# Patient Record
Sex: Male | Born: 1990 | Race: White | Hispanic: No | Marital: Single | Smoking: Former smoker
Health system: Southern US, Community
[De-identification: ages and names within clinical notes are randomized; demographics above are authoritative.]

## PROBLEM LIST (undated history)

## (undated) DIAGNOSIS — S3600XA Unspecified injury of spleen, initial encounter: Secondary | ICD-10-CM

## (undated) DIAGNOSIS — M549 Dorsalgia, unspecified: Secondary | ICD-10-CM

---
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
## 2008-02-14 ENCOUNTER — Emergency Department (HOSPITAL_COMMUNITY): Admission: EM | Admit: 2008-02-14 | Discharge: 2008-02-14 | Payer: Self-pay | Admitting: Emergency Medicine

## 2009-06-25 ENCOUNTER — Emergency Department (HOSPITAL_COMMUNITY): Admission: EM | Admit: 2009-06-25 | Discharge: 2009-06-26 | Payer: Self-pay | Admitting: Emergency Medicine

## 2009-07-01 ENCOUNTER — Inpatient Hospital Stay (HOSPITAL_COMMUNITY): Admission: EM | Admit: 2009-07-01 | Discharge: 2009-07-03 | Payer: Self-pay | Admitting: Emergency Medicine

## 2009-07-07 ENCOUNTER — Inpatient Hospital Stay (HOSPITAL_COMMUNITY): Admission: EM | Admit: 2009-07-07 | Discharge: 2009-07-10 | Payer: Self-pay | Admitting: Emergency Medicine

## 2009-08-31 ENCOUNTER — Encounter: Admission: RE | Admit: 2009-08-31 | Discharge: 2009-08-31 | Payer: Self-pay | Admitting: General Surgery

## 2009-10-23 ENCOUNTER — Emergency Department (HOSPITAL_COMMUNITY): Admission: EM | Admit: 2009-10-23 | Discharge: 2009-10-24 | Payer: Self-pay | Admitting: Emergency Medicine

## 2010-01-09 ENCOUNTER — Ambulatory Visit (HOSPITAL_COMMUNITY): Admission: RE | Admit: 2010-01-09 | Discharge: 2010-01-09 | Payer: Self-pay | Admitting: Emergency Medicine

## 2010-06-10 ENCOUNTER — Encounter: Payer: Self-pay | Admitting: General Surgery

## 2010-08-09 LAB — CBC
HCT: 28.9 % — ABNORMAL LOW (ref 39.0–52.0)
Hemoglobin: 11.1 g/dL — ABNORMAL LOW (ref 13.0–17.0)
Hemoglobin: 9.9 g/dL — ABNORMAL LOW (ref 13.0–17.0)
MCHC: 34.5 g/dL (ref 30.0–36.0)
MCHC: 34.6 g/dL (ref 30.0–36.0)
MCHC: 34.9 g/dL (ref 30.0–36.0)
MCHC: 35 g/dL (ref 30.0–36.0)
MCV: 89.9 fL (ref 78.0–100.0)
MCV: 89.9 fL (ref 78.0–100.0)
MCV: 90.4 fL (ref 78.0–100.0)
Platelets: 272 10*3/uL (ref 150–400)
Platelets: 322 10*3/uL (ref 150–400)
Platelets: 392 10*3/uL (ref 150–400)
RBC: 3.57 MIL/uL — ABNORMAL LOW (ref 4.22–5.81)
RBC: 3.66 MIL/uL — ABNORMAL LOW (ref 4.22–5.81)
RDW: 12.1 % (ref 11.5–15.5)
RDW: 12.3 % (ref 11.5–15.5)
WBC: 5.4 10*3/uL (ref 4.0–10.5)
WBC: 7.7 10*3/uL (ref 4.0–10.5)

## 2010-08-09 LAB — POCT I-STAT, CHEM 8
Calcium, Ion: 1.17 mmol/L (ref 1.12–1.32)
HCT: 33 % — ABNORMAL LOW (ref 39.0–52.0)
Hemoglobin: 11.2 g/dL — ABNORMAL LOW (ref 13.0–17.0)
Potassium: 3.9 mEq/L (ref 3.5–5.1)

## 2010-08-09 LAB — URINALYSIS, ROUTINE W REFLEX MICROSCOPIC
Bilirubin Urine: NEGATIVE
Glucose, UA: NEGATIVE mg/dL
Ketones, ur: NEGATIVE mg/dL
Urobilinogen, UA: 1 mg/dL (ref 0.0–1.0)

## 2010-08-09 LAB — DIFFERENTIAL
Basophils Absolute: 0 10*3/uL (ref 0.0–0.1)
Eosinophils Absolute: 0.1 10*3/uL (ref 0.0–0.7)
Eosinophils Relative: 2 % (ref 0–5)
Eosinophils Relative: 3 % (ref 0–5)
Lymphocytes Relative: 28 % (ref 12–46)
Lymphs Abs: 1.7 10*3/uL (ref 0.7–4.0)
Lymphs Abs: 1.8 10*3/uL (ref 0.7–4.0)
Monocytes Absolute: 0.8 10*3/uL (ref 0.1–1.0)
Monocytes Relative: 14 % — ABNORMAL HIGH (ref 3–12)

## 2010-08-09 LAB — COMPREHENSIVE METABOLIC PANEL
ALT: 28 U/L (ref 0–53)
Albumin: 4.5 g/dL (ref 3.5–5.2)
Alkaline Phosphatase: 67 U/L (ref 39–117)
GFR calc Af Amer: >60 mL/min (ref >60)
GFR calc non Af Amer: >60 mL/min (ref >60)
Potassium: 3.7 mEq/L (ref 3.5–5.1)
Total Protein: 7.3 g/dL (ref 6.0–8.3)

## 2010-08-09 LAB — HEMOGLOBIN AND HEMATOCRIT, BLOOD
HCT: 27.8 % — ABNORMAL LOW (ref 39.0–52.0)
HCT: 31 % — ABNORMAL LOW (ref 39.0–52.0)
Hemoglobin: 10.5 g/dL — ABNORMAL LOW (ref 13.0–17.0)
Hemoglobin: 10.6 g/dL — ABNORMAL LOW (ref 13.0–17.0)
Hemoglobin: 11 g/dL — ABNORMAL LOW (ref 13.0–17.0)
Hemoglobin: 11.5 g/dL — ABNORMAL LOW (ref 13.0–17.0)
Hemoglobin: 9.7 g/dL — ABNORMAL LOW (ref 13.0–17.0)

## 2010-08-09 LAB — MONONUCLEOSIS SCREEN: Mono Screen: NEGATIVE

## 2010-08-09 LAB — PROTIME-INR: Prothrombin Time: 13.5 seconds (ref 11.6–15.2)

## 2010-08-09 LAB — TYPE AND SCREEN

## 2010-10-05 NOTE — Op Note (Signed)
[place]  Patient:    Andrew Castro, Andrew Castro                        MRN: [id number] Proc. Date: 10/30/99 Adm. Date:  [id number] Disc. Date: [id number] Attending:  Ellwood Handler CC:         Mary B. Lyn Hollingshead, M.D.                           Operative Report  DATE OF BIRTH:  Jan 10, 1991  REFERRING PHYSICIAN:  Patriciaann Clan. Lyn Hollingshead, M.D.  PREOPERATIVE DIAGNOSIS:  Balanitis and phimosis.  POSTOPERATIVE DIAGNOSIS:  Balanitis and phimosis.  PROCEDURE:  Circumcision.  SURGEON:  Verl Dicker, M.D.  ANESTHESIA:  General with a penile block.  INDICATIONS:  An 20 year old male with progressive problems with balanitis and phimosis.  DESCRIPTION OF PROCEDURE:  The patient was prepped and draped in the supine position after institution of an adequate level of general anesthesia (LMA). Preputial adhesions were taken down bluntly.  Smegma was cleaned from the subcoronal sulcus.  A circumferential incision was made proximal to the subcoronal sulcus.  A similar incision was made proximal to the original incision, and a ring of fibrotic preputial skin was removed in a "parallel lines technique."  Bleeding sites were lightly fulgurated using needle-tip cautery.  The skin edges were then reapproximated with interrupted sutures of 4-0 chromic.  The wound was covered with Bacitracin ointment, dry gauze, and Coban tape, and the patient was returned to recovery in satisfactory condition.  A penile block had been instituted prior to the beginning of the procedure using 0.25% Marcaine without epinephrine circumferentially around the base of the penis. DD:  10/30/99 TD:  11/01/99 Job: [id number]

## 2011-01-01 IMAGING — CT CT ABDOMEN W/ CM
2 of 4 series · 17 of 46 positions shown, 19 images · IV contrast (omnipaque)
Comparison: 07/01/2009.

CLINICAL DATA: Left-sided abdominal pain.  Fever.  Evaluate
splenic rupture.

CT ABDOMEN AND PELVIS WITH CONTRAST
TECHNIQUE: Multidetector CT imaging of the abdomen and pelvis was
performed using the standard protocol following bolus
administration of intravenous contrast.
Contrast: Wonder mL Omnipaque-JDD.

[Series 2: rtn ap with st · axial · 0.74mm/px · z∈[+942,+1218]mm · 14 of 61 slices shown, 16 images]
[im 3/61  soft-tissue]
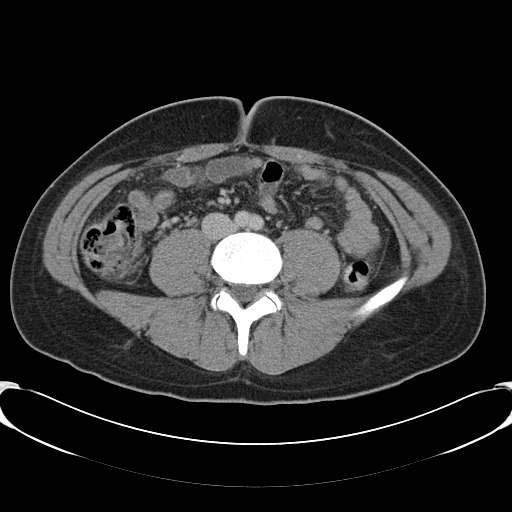
[im 3/61  bone]
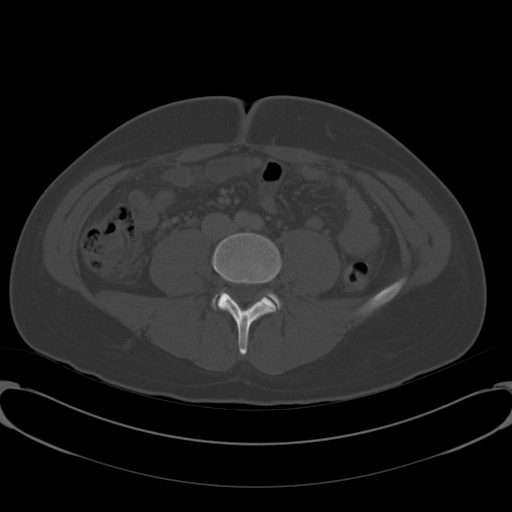
[im 8/61  soft-tissue]
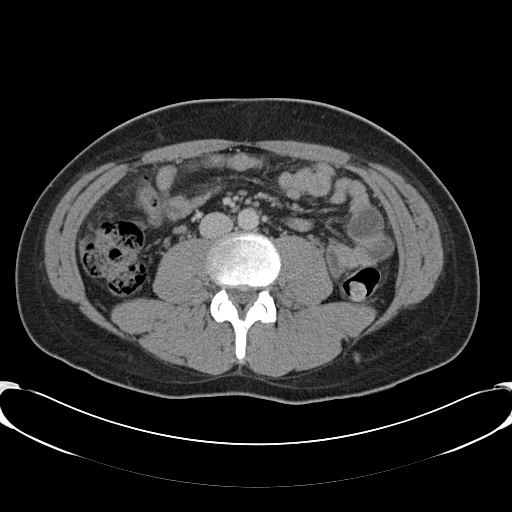
[im 11/61  soft-tissue]
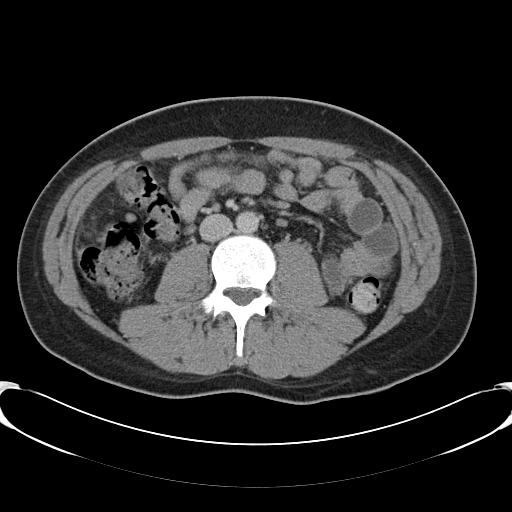
[im 16/61  soft-tissue]
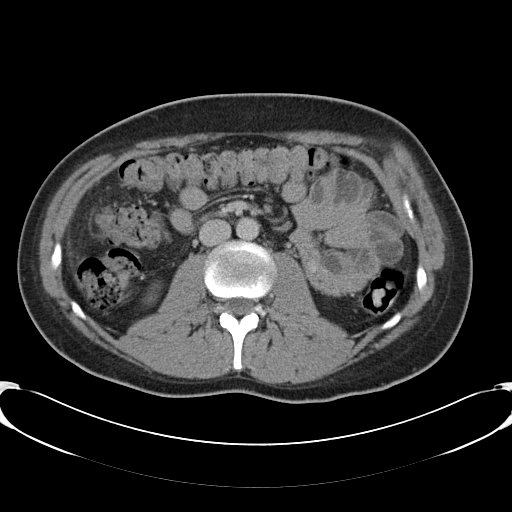
[im 21/61  soft-tissue]
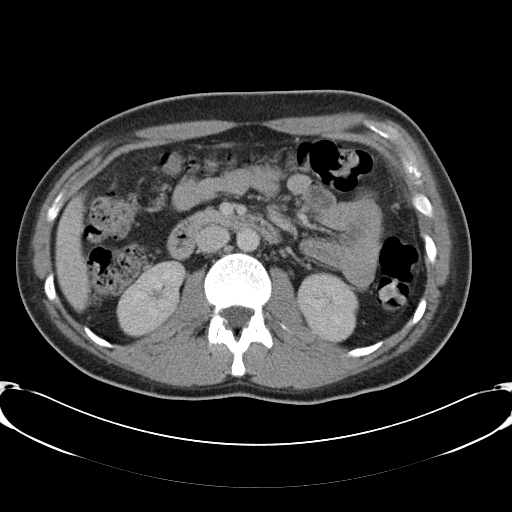
[im 24/61  soft-tissue]
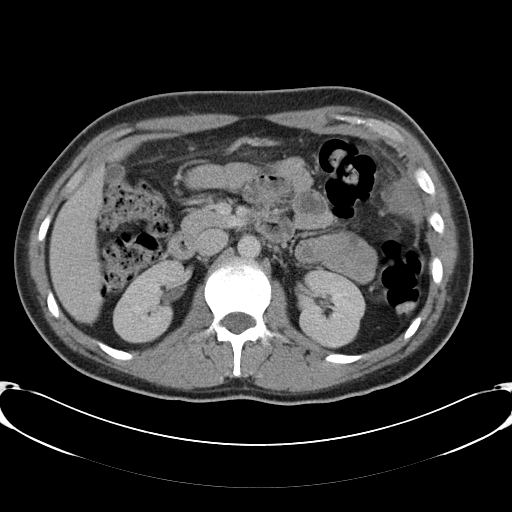
[im 29/61  soft-tissue]
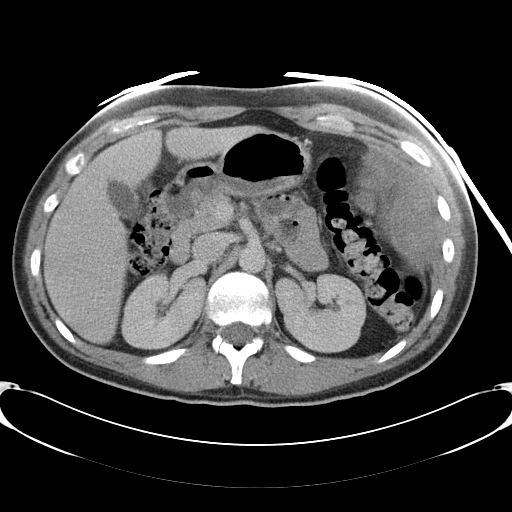
[im 32/61  soft-tissue]
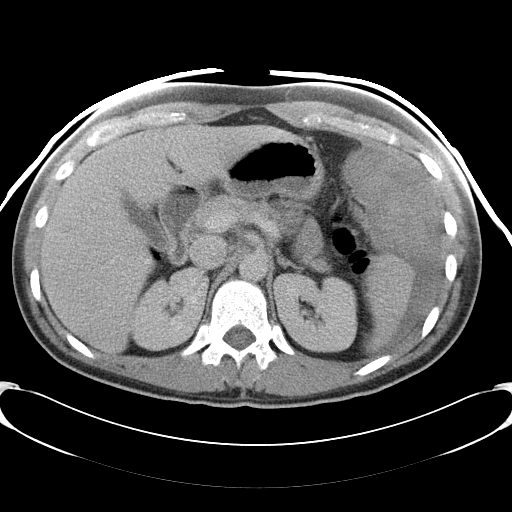
[im 37/61  soft-tissue]
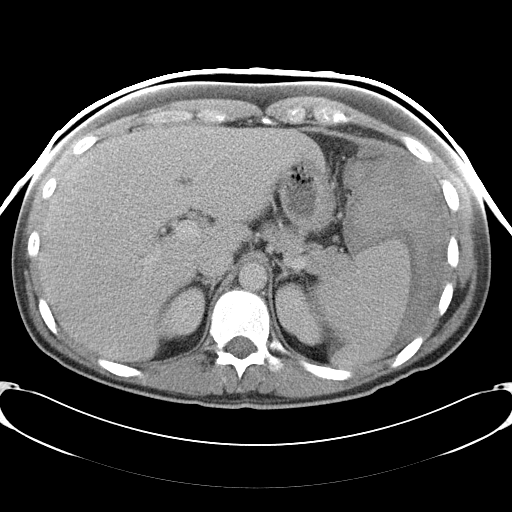
[im 37/61  bone]
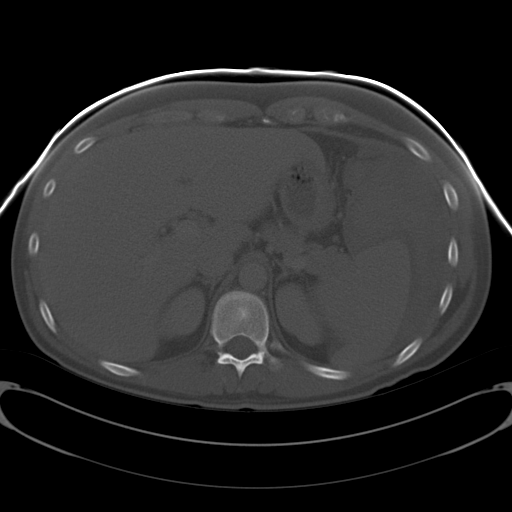
[im 40/61  soft-tissue]
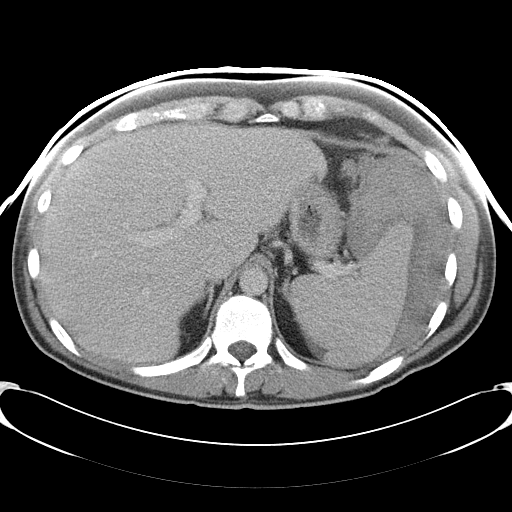
[im 45/61  soft-tissue]
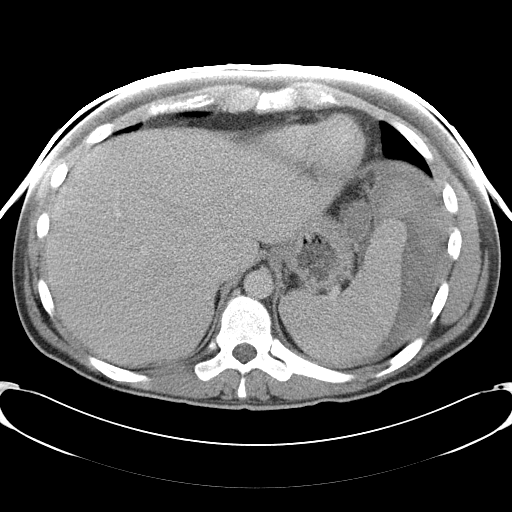
[im 50/61  soft-tissue]
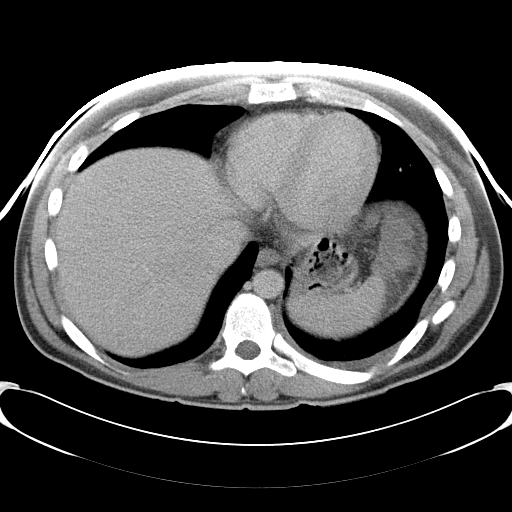
[im 53/61  soft-tissue]
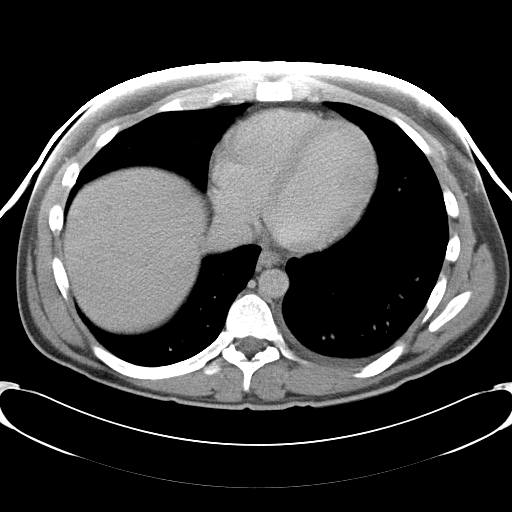
[im 58/61  soft-tissue]
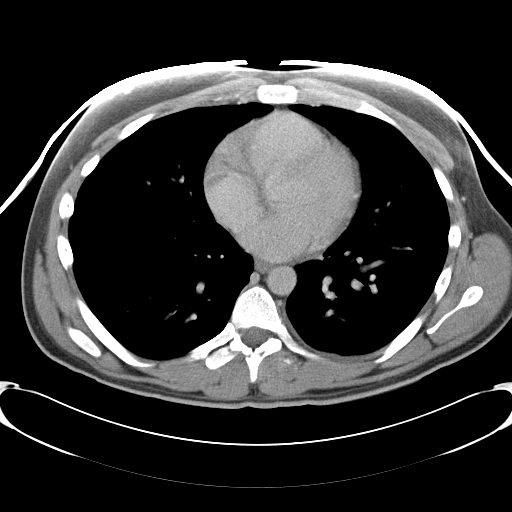

[Series 602: <mpr thick range> · coronal · 0.74mm/px · 3 of 76 slices shown]
[im 26/76  soft-tissue]
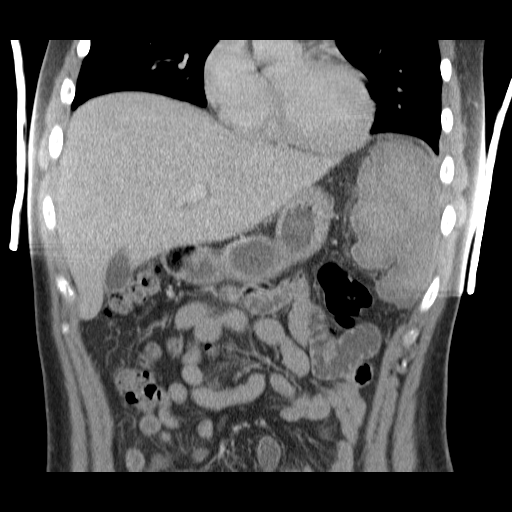
[im 34/76  soft-tissue]
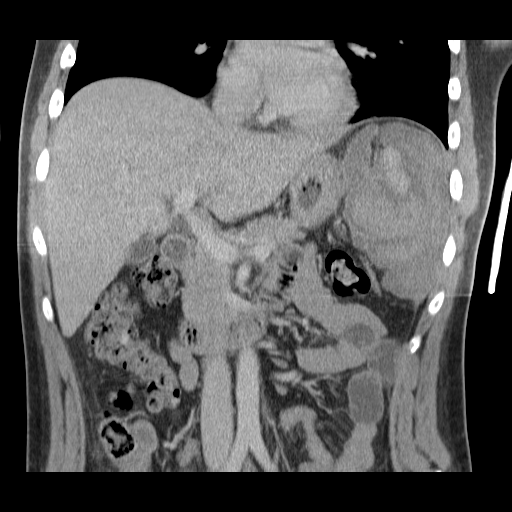
[im 42/76  soft-tissue]
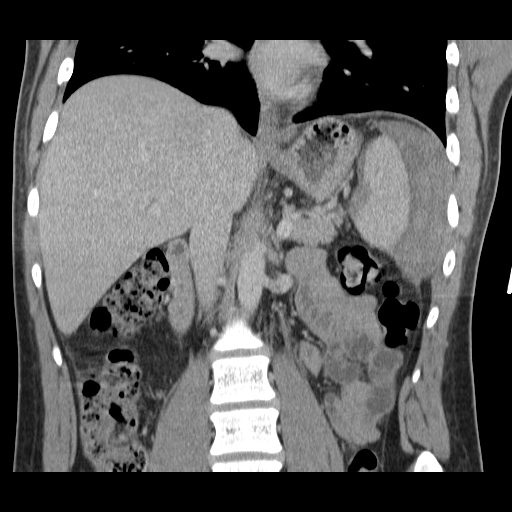

[17 of 46 positions shown; findings below may reference images not displayed]

FINDINGS: The dominant finding is a slight interval increase in
the size of perisplenic hematoma.  When measured at a similar point
to the prior examination, the hematoma on axial imaging just
anterior to the splenic margin measures 6.5 cm AP by 7.6 cm
transverse.  When measured on coronal [HOSPITAL] a similar location
to prior, this measures 13.7 cm cranial-caudal and 7.7 cm
transverse compared to 12.7 cm x 6.7 cm.

The liver and gallbladder as well as the kidneys and adrenals
appear normal.  Spleen unremarkable.  The site of splenic
laceration appears to be at the superior margin along the medial
edge.  Splenic artery appears normal.  Stomach and bowel
unremarkable.  No active extravasation is identified.
IMPRESSION: Slight interval enlargement of right upper quadrant perisplenic
hematoma.  This is previously quantify however these measurements
are inaccurate because of the crescentic configuration.

## 2011-07-02 ENCOUNTER — Ambulatory Visit (INDEPENDENT_AMBULATORY_CARE_PROVIDER_SITE_OTHER): Payer: BC Managed Care – PPO | Admitting: Physician Assistant

## 2011-07-02 VITALS — BP 112/78 | HR 74 | Temp 98.0°F | Resp 16 | Ht 74.0 in | Wt 190.0 lb

## 2011-07-02 DIAGNOSIS — J069 Acute upper respiratory infection, unspecified: Secondary | ICD-10-CM

## 2011-07-02 NOTE — Progress Notes (Signed)
SUBJECTIVE:  Andrew Castro is a 21 y.o. male who complains of congestion, nasal blockage, post nasal drip, headache, fatigue and bilateral sinus pain for 4 days. He denies a history of chills, fevers and cough and denies a history of asthma. Patient just quit smoking cigarettes 3 weeks ago. Using Tylenol Cold and Sinus.  OBJECTIVE: He appears well, vital signs are as noted. Ears normal.  Throat and pharynx red.  Neck supple. No adenopathy in the neck. Nose is congested. Sinuses non tender. The chest is clear, without wheezes or rales.  ASSESSMENT:  viral upper respiratory illness  PLAN: Symptomatic therapy suggested: push fluids, rest, gargle warm salt water, use ibuprofen and Mucinex D prn and return office visit prn if symptoms persist or worsen. Lack of antibiotic effectiveness discussed with him. Call or return to clinic prn if these symptoms worsen or fail to improve as anticipated.

## 2011-07-02 NOTE — Progress Notes (Deleted)
  Subjective:    Patient ID: Andrew Castro, male    DOB: 1990-11-16, 21 y.o.   MRN: [id number]  HPI    Review of Systems     Objective:   Physical Exam        Assessment & Plan:

## 2011-08-05 ENCOUNTER — Encounter: Payer: BC Managed Care – PPO | Admitting: Physician Assistant

## 2012-01-24 NOTE — Progress Notes (Signed)
This encounter was created in error - please disregard.

## 2012-03-24 ENCOUNTER — Ambulatory Visit (INDEPENDENT_AMBULATORY_CARE_PROVIDER_SITE_OTHER): Payer: BC Managed Care – PPO | Admitting: Internal Medicine

## 2012-03-24 VITALS — BP 148/86 | HR 73 | Temp 98.1°F | Resp 18 | Ht 75.0 in | Wt 215.0 lb

## 2012-03-24 DIAGNOSIS — B081 Molluscum contagiosum: Secondary | ICD-10-CM

## 2012-03-24 DIAGNOSIS — Z23 Encounter for immunization: Secondary | ICD-10-CM

## 2012-03-24 MED ORDER — PODOFILOX 0.5 % EX GEL: Freq: Two times a day (BID) | CUTANEOUS | Status: DC

## 2012-03-24 NOTE — Progress Notes (Signed)
  Subjective:    Patient ID: Andrew Castro, male    DOB: 02-14-1991, 21 y.o.   MRN: [id number]  HPI Healthy male with rash on genitals 1 month   Review of Systems     Objective:   Physical Exam Classic molluscum contagiosum       Assessment & Plan:  Molluscum contagiosum Trial of condylox HPV vac 1st dose Counsel

## 2012-03-24 NOTE — Patient Instructions (Signed)
Molluscum Contagiosum  Molluscum contagiosum is a viral infection of the skin that causes smooth surfaced, firm, small (3 to 5 mm), dome-shaped bumps (papules) which are flesh-colored. The bumps usually do not hurt or itch. In children, they most often appear on the face, trunk, arms and legs. In adults, the growths are commonly found on the genitals, thighs, face, neck, and belly (abdomen). The infection may be spread to others by close (skin to skin) contact (such as occurs in schools and swimming pools), sharing towels and clothing, and through sexual contact. The bumps usually disappear without treatment in 2 to 4 months, especially in children. You may have them treated to avoid spreading them. Scraping (curetting) the middle part (central plug) of the bump with a needle or sharp curette, or application of liquid nitrogen for 8 or 9 seconds usually cures the infection.  HOME CARE INSTRUCTIONS    Do not scratch the bumps. This may spread the infection to other parts of the body and to other people.   Avoid close contact with others, including sexual contact, until the bumps disappear. Do not share towels or clothing.   If liquid nitrogen was used, blisters will form. Leave the blisters alone and cover with a bandage. The tops will fall off by themselves in 7 to 14 days.   Four months without a lesion is usually a cure.  SEEK IMMEDIATE MEDICAL CARE IF:   You have a fever.   You develop swelling, redness, pain, tenderness, or warmth in the areas of the bumps. They may be infected.  Document Released: 05/03/2000 Document Revised: 07/29/2011 Document Reviewed: 10/14/2008  Exit[employer] Patient Information 2013 [employer].

## 2012-10-04 ENCOUNTER — Encounter (HOSPITAL_COMMUNITY): Payer: Self-pay | Admitting: *Deleted

## 2012-10-04 ENCOUNTER — Emergency Department (HOSPITAL_COMMUNITY)
Admission: EM | Admit: 2012-10-04 | Discharge: 2012-10-05 | Disposition: A | Discharge status: Home / Self Care | Payer: BC Managed Care – PPO | Attending: Emergency Medicine | Admitting: Emergency Medicine

## 2012-10-04 DIAGNOSIS — Z87828 Personal history of other (healed) physical injury and trauma: Secondary | ICD-10-CM | POA: Insufficient documentation

## 2012-10-04 DIAGNOSIS — M545 Low back pain, unspecified: Secondary | ICD-10-CM | POA: Insufficient documentation

## 2012-10-04 DIAGNOSIS — Z8719 Personal history of other diseases of the digestive system: Secondary | ICD-10-CM | POA: Insufficient documentation

## 2012-10-04 DIAGNOSIS — M549 Dorsalgia, unspecified: Secondary | ICD-10-CM

## 2012-10-04 DIAGNOSIS — Z87891 Personal history of nicotine dependence: Secondary | ICD-10-CM | POA: Insufficient documentation

## 2012-10-04 HISTORY — DX: Unspecified injury of spleen, initial encounter: S36.00XA

## 2012-10-04 HISTORY — DX: Dorsalgia, unspecified: M54.9

## 2012-10-04 MED ORDER — IBUPROFEN 800 MG PO TABS: 800.0000 mg | ORAL_TABLET | Freq: Three times a day (TID) | ORAL | Status: DC

## 2012-10-04 MED ORDER — CYCLOBENZAPRINE HCL 10 MG PO TABS: 10.0000 mg | ORAL_TABLET | Freq: Two times a day (BID) | ORAL | Status: DC | PRN

## 2012-10-04 MED ORDER — OXYCODONE-ACETAMINOPHEN 5-325 MG PO TABS
1.0000 | ORAL_TABLET | Freq: Once | ORAL | Status: AC
  Administered 2012-10-05: 1 via ORAL
  Filled 2012-10-04: qty 1

## 2012-10-04 MED ORDER — OXYCODONE-ACETAMINOPHEN 5-325 MG PO TABS: 1.0000 | ORAL_TABLET | ORAL | Status: DC | PRN

## 2012-10-04 NOTE — ED Notes (Signed)
Two months ago injuried back, today slide off stool, now states he can not stand,

## 2012-10-04 NOTE — ED Provider Notes (Signed)
History    This chart was scribed for non-physician practitioner Elpidio Anis PA-C working with Doug Sou, MD by Smitty Pluck, ED scribe. This patient was seen in room WTR7/WTR7 and the patient's care was started at 11:36 PM.    CSN: [id number]  Arrival date & time 10/04/12  2118      Chief Complaint  Patient presents with  . Back Pain    The history is provided by the patient and medical records. No language interpreter was used.   HPI Comments: Andrew Castro is a 22 y.o. male who presents to the Emergency Department complaining of constant, moderate, lower back pain onset today. Pt reports that he injured his back 2 months ago and wore a back brace since then. Today he pushed a stool and had sudden onset of back pain. He has taken flexeril and tylenol without relief. He reports that he unable to stand due to pain. Pt denies fall, numbness, radiation of pain, urinary/bowel incontinence, fever, chills, nausea, vomiting, diarrhea, weakness, cough, SOB and any other pain.   Past Medical History  Diagnosis Date  . Back pain   . Spleen injury     History reviewed. No pertinent past surgical history.  No family history on file.  History  Substance Use Topics  . Smoking status: Former Smoker    Quit date: 06/11/2011  . Smokeless tobacco: Not on file  . Alcohol Use: Yes     Comment: occasionally      Review of Systems  Constitutional: Negative for fever and chills.  Respiratory: Negative for shortness of breath.   Gastrointestinal: Negative for nausea and vomiting.  Musculoskeletal: Positive for back pain.  Neurological: Negative for weakness.    Allergies  Review of patient's allergies indicates no known allergies.  Home Medications   Current Outpatient Rx  Name  Route  Sig  Dispense  Refill  . podofilox (CONDYLOX) 0.5 % gel   Topical   Apply topically 2 (two) times daily. Apply 3days per week for 2-3 weeks   3.5 g   1     BP 129/88  Pulse 116   Temp(Src) 98.4 F (36.9 C) (Oral)  Resp 20  Ht 6\' 3"  (1.905 m)  Wt 225 lb (102.059 kg)  BMI 28.12 kg/m2  SpO2 100%  Physical Exam  Nursing note and vitals reviewed. Constitutional: He is oriented to person, place, and time. He appears well-developed and well-nourished. No distress.  HENT:  Head: Normocephalic and atraumatic.  Eyes: EOM are normal.  Neck: Neck supple. No tracheal deviation present.  Cardiovascular: Normal rate, regular rhythm and normal heart sounds.   Pulmonary/Chest: Effort normal and breath sounds normal. No respiratory distress.  Abdominal: Soft. He exhibits no distension. There is no tenderness.  Musculoskeletal: Normal range of motion.  Midline lumbar tenderness No swelling  Moves all extremities  Neurological: He is alert and oriented to person, place, and time.  Skin: Skin is warm and dry.  Psychiatric: He has a normal mood and affect. His behavior is normal.    ED Course  Procedures (including critical care time) DIAGNOSTIC STUDIES: Oxygen Saturation is 100% on room air, normal by my interpretation.    COORDINATION OF CARE: 11:38 PM Discussed ED treatment with pt and pt agrees.     Labs Reviewed - No data to display No results found.   No diagnosis found.  1. Low back pain   MDM  Uncomplicated low back pain without neurologic deficit.  I personally performed the services described in this documentation, which was scribed in my presence. The recorded information has been reviewed and is accurate.     Arnoldo Hooker, PA-C 10/05/12 0104

## 2012-10-06 NOTE — ED Provider Notes (Signed)
Medical screening examination/treatment/procedure(s) were performed by non-physician practitioner and as supervising physician I was immediately available for consultation/collaboration.  Doug Sou, MD 10/06/12 1504

## 2013-01-11 ENCOUNTER — Ambulatory Visit (INDEPENDENT_AMBULATORY_CARE_PROVIDER_SITE_OTHER): Payer: BC Managed Care – PPO | Admitting: Family Medicine

## 2013-01-11 VITALS — BP 122/74 | HR 72 | Temp 98.8°F | Resp 18 | Ht 74.25 in | Wt 230.6 lb

## 2013-01-11 DIAGNOSIS — J3489 Other specified disorders of nose and nasal sinuses: Secondary | ICD-10-CM

## 2013-01-11 DIAGNOSIS — R0981 Nasal congestion: Secondary | ICD-10-CM

## 2013-01-11 LAB — POCT CBC
Granulocyte percent: 58.2 %G (ref 37–80)
Lymph, poc: 1.7 (ref 0.6–3.4)
POC Granulocyte: 3 (ref 2–6.9)
POC MID %: 8.5 %M (ref 0–12)
Platelet Count, POC: 260 10*3/uL (ref 142–424)
RDW, POC: 13.5 %
WBC: 5.1 10*3/uL (ref 4.6–10.2)

## 2013-01-11 MED ORDER — PREDNISONE 20 MG PO TABS: ORAL_TABLET | ORAL | Status: DC

## 2013-01-11 NOTE — Progress Notes (Signed)
[facility] [address] [place] [address] [phone]  Date:  01/11/2013   Name:  Andrew Castro   DOB:  01-19-1991   MRN:  [id number]  PCP:  No primary provider on file.    Chief Complaint: Nasal Congestion, Sore Throat and Cough   History of Present Illness:  Andrew Castro is a 22 y.o. very pleasant male patient who presents with the following:  A few days ago he noted onset of fatigue, cough.  He felt worse today. He has noted sneezing, dry cough, watery eyes, feels very tired, severe nasal congestion.  He has itchy eyes and throat.  No fever.  No body aches or chills.  No sore throat.   No GI symptoms.    So far he tried an OTC dayquil He is generally healthy, is not smoking.   His friend/ coworker was ill recently.  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    There are no active problems to display for this patient.   Past Medical History  Diagnosis Date  . Back pain   . Spleen injury     History reviewed. No pertinent past surgical history.  History  Substance Use Topics  . Smoking status: Former Smoker    Quit date: 06/11/2011  . Smokeless tobacco: Not on file  . Alcohol Use: Yes     Comment: occasionally    History reviewed. No pertinent family history.  No Known Allergies  Medication list has been reviewed and updated.  Current Outpatient Prescriptions on File Prior to Visit  Medication Sig Dispense Refill  . cyclobenzaprine (FLEXERIL) 10 MG tablet Take 1 tablet (10 mg total) by mouth 2 (two) times daily as needed for muscle spasms.  20 tablet  0  . ibuprofen (ADVIL,MOTRIN) 800 MG tablet Take 1 tablet (800 mg total) by mouth 3 (three) times daily.  21 tablet  0  . oxyCODONE-acetaminophen (PERCOCET/ROXICET) 5-325 MG per tablet Take 1 tablet by mouth every 4 (four) hours as needed for pain.  15 tablet  0  . podofilox (CONDYLOX) 0.5 % gel Apply topically 2 (two)  times daily. Apply 3days per week for 2-3 weeks  3.5 g  1   No current facility-administered medications on file prior to visit.    Review of Systems:  As per HPI- otherwise negative.   Physical Examination: Filed Vitals:   01/11/13 1602  BP: 122/74  Pulse: 72  Temp: 98.8 F (37.1 C)  Resp: 18   Filed Vitals:   01/11/13 1602  Height: 6' 2.25" (1.886 m)  Weight: 230 lb 9.6 oz (104.599 kg)   Body mass index is 29.41 kg/(m^2). Ideal Body Weight: Weight in (lb) to have BMI = 25: 195.6  GEN: WDWN, NAD, Non-toxic, A & O x 3, looks well HEENT: Atraumatic, Normocephalic. Neck supple. No masses, No LAD.  Bilateral TM wnl, oropharynx normal.  PEERL,EOMI.  Nasal cavity is inflamed Ears and Nose: No external deformity. CV: RRR, No M/G/R. No JVD. No thrill. No extra heart sounds. PULM: CTA B, no wheezes, crackles, rhonchi. No retractions. No resp. distress. No accessory muscle use. ABD: S, NT, ND EXTR: No c/c/e NEURO Normal gait.  PSYCH: Normally interactive. Conversant. Not depressed or anxious appearing.  Calm demeanor.   Results for orders placed in visit on 01/11/13  POCT CBC      Result Value Range  WBC 5.1  4.6 - 10.2 K/uL   Lymph, poc 1.7  0.6 - 3.4   POC LYMPH PERCENT 33.3  10 - 50 %L   MID (cbc) 0.4  0 - 0.9   POC MID % 8.5  0 - 12 %M   POC Granulocyte 3.0  2 - 6.9   Granulocyte percent 58.2  37 - 80 %G   RBC 5.39  4.69 - 6.13 M/uL   Hemoglobin 16.3  14.1 - 18.1 g/dL   HCT, POC 16.1  09.6 - 53.7 %   MCV 93.0  80 - 97 fL   MCH, POC 30.2  27 - 31.2 pg   MCHC 32.5  31.8 - 35.4 g/dL   RDW, POC 04.5     Platelet Count, POC 260  142 - 424 K/uL   MPV 10.1  0 - 99.8 fL    Assessment and Plan: Nasal congestion - Plan: POCT CBC, predniSONE (DELTASONE) 20 MG tablet  Suspect viral infection or allergic rhinitis.  He is miserable with nasal congestion and drainage. Will treat with prednisone- 3 day course only.  Advised regarding OTC medications as well.  Let me know if  not better in the next few days, Sooner if worse.     Signed Abbe Amsterdam, MD

## 2013-01-11 NOTE — Patient Instructions (Addendum)
Use the prednisone as directed.  Drink plenty of water.  Use tylenol as needed for aches and pains, and use OTC zyrtec or claritin every day for the next week or two.  You can use ibuprofen once you are done with the prednisone.   If you are not better in the next few days please let me know- Sooner if worse.

## 2013-05-14 ENCOUNTER — Ambulatory Visit (INDEPENDENT_AMBULATORY_CARE_PROVIDER_SITE_OTHER): Payer: BC Managed Care – PPO | Admitting: Internal Medicine

## 2013-05-14 VITALS — BP 158/94 | HR 112 | Temp 99.0°F | Resp 18 | Ht 74.5 in | Wt 237.0 lb

## 2013-05-14 DIAGNOSIS — J019 Acute sinusitis, unspecified: Secondary | ICD-10-CM

## 2013-05-14 MED ORDER — AMOXICILLIN 875 MG PO TABS: 875.0000 mg | ORAL_TABLET | Freq: Two times a day (BID) | ORAL | Status: DC

## 2013-05-14 MED ORDER — HYDROCODONE-HOMATROPINE 5-1.5 MG/5ML PO SYRP: 5.0000 mL | ORAL_SOLUTION | Freq: Four times a day (QID) | ORAL | Status: DC | PRN

## 2013-05-14 NOTE — Progress Notes (Signed)
   Subjective:    Patient ID: Andrew Castro, male    DOB: [birth year], 22 y.o.   MRN: [id number]  HPI illness started as a cold and has progressed and facial pressure with headaches and purulent sinus discharge over the past 3 days No fever Nonproductive cough which keeps him awake at night Doing well in general previously healthy   [employer] burrito Review of Systems Noncontributory    Objective:   Physical Exam BP 158/94  Pulse 112  Temp(Src) 99 F (37.2 C) (Oral)  Resp 18  Ht 6' 2.5" (1.892 m)  Wt 237 lb (107.502 kg)  BMI 30.03 kg/m2  SpO2 99% TMs clear Nares boggy with purulent discharge Tender maxillary areas to percussion Throat clear No nodes Chest clear       Assessment & Plan:  Acute sinusitis, unspecified  elevated blood pressure without diagnosis of hypertension-needs recheck when well Meds ordered this encounter  Medications  . amoxicillin (AMOXIL) 875 MG tablet    Sig: Take 1 tablet (875 mg total) by mouth 2 (two) times daily.    Dispense:  20 tablet    Refill:  0  . HYDROcodone-homatropine (HYCODAN) 5-1.5 MG/5ML syrup    Sig: Take 5 mLs by mouth every 6 (six) hours as needed for cough.    Dispense:  120 mL    Refill:  0

## 2014-12-16 ENCOUNTER — Ambulatory Visit (INDEPENDENT_AMBULATORY_CARE_PROVIDER_SITE_OTHER): Payer: 59 | Admitting: Internal Medicine

## 2014-12-16 VITALS — BP 118/76 | HR 82 | Temp 98.6°F | Resp 16 | Ht 75.0 in | Wt 249.2 lb

## 2014-12-16 DIAGNOSIS — R5383 Other fatigue: Secondary | ICD-10-CM

## 2014-12-16 DIAGNOSIS — R0683 Snoring: Secondary | ICD-10-CM

## 2014-12-16 DIAGNOSIS — R202 Paresthesia of skin: Secondary | ICD-10-CM

## 2014-12-16 DIAGNOSIS — M545 Low back pain, unspecified: Secondary | ICD-10-CM

## 2014-12-16 DIAGNOSIS — F419 Anxiety disorder, unspecified: Secondary | ICD-10-CM

## 2014-12-16 LAB — POCT CBC
Granulocyte percent: 49.7 %G (ref 37–80)
HEMATOCRIT: 47.3 % (ref 43.5–53.7)
Hemoglobin: 16 g/dL (ref 14.1–18.1)
Lymph, poc: 1.4 (ref 0.6–3.4)
MCH: 28.9 pg (ref 27–31.2)
MCHC: 33.9 g/dL (ref 31.8–35.4)
MCV: 85.2 fL (ref 80–97)
MID (cbc): 0.4 (ref 0–0.9)
MPV: 8.5 fL (ref 0–99.8)
PLATELET COUNT, POC: 226 10*3/uL (ref 142–424)
POC Granulocyte: 1.8 — AB (ref 2–6.9)
POC LYMPH PERCENT: 39 %L (ref 10–50)
POC MID %: 11.3 %M (ref 0–12)
RBC: 5.55 M/uL (ref 4.69–6.13)
RDW, POC: 13.4 %
WBC: 3.7 10*3/uL — AB (ref 4.6–10.2)

## 2014-12-16 LAB — GLUCOSE, POCT (MANUAL RESULT ENTRY): POC Glucose: 51 mg/dl — AB (ref 70–99)

## 2014-12-16 MED ORDER — ALPRAZOLAM 0.25 MG PO TABS: 0.2500 mg | ORAL_TABLET | Freq: Three times a day (TID) | ORAL | Status: DC | PRN

## 2014-12-16 NOTE — Progress Notes (Addendum)
Subjective:    Patient ID: Andrew Castro, male    DOB: [birth year], 24 y.o.   MRN: [id number] This chart was scribed for Ellamae Sia, MD by Jolene Provost, Medical Scribe. This patient was seen in Room 12 and the patient's care was started a 4:28 PM.  Chief Complaint  Patient presents with  . Numbness    both feet, toes , comes and goes   . Fatigue    x 2 months  . soreness under eye    rt., x 2 days    HPI HPI Comments: Andrew Castro is a 24 y.o. male who presents to [facility] complaining of foot numbness for the last two months. Pt states this sx happene intermittently at various times per day. Pt states when it is happening he can touch his toes and won't be able to feel the pressure. He can put his feet up without any change in his sx. 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 He symptoms are confined to the tips of all of his toes  Pt is also complaining of increased fatigue for the last month, worse around noon. Pt states he is also having trouble sleeping at night. He has a 26 year old at home. The pt's wife has told him his snoring has become really bad lately. Pt drove seven hours to [place] with his shoes off, and he experienced numbness in his feet. Pt also states he has intermittent low back pain, and states he threw his back out on the second day of his new job to the point that he saw stars.   Pt states he is urinating more, and had two beers a few weeks ago and had to pea every ten minutes, which has never happened before.   Pt is also complaining of swelling in hands for the last few weeks. Now working at a new job in the heat all day.  Father just died of liver cancer 2 months after his diagnosis and this was just 2 months ago. He finds himself thinking about his father every day and morning the loss of his father's role in the life of his 85 month old. This is caused him to be anxious all the time when he is left alone to  think. Is affecting him at work and for sleep.  He has had no recent febrile illnesses  Happily married new 21-month-old baby  Review of Systems  Constitutional: Negative for fever and chills.  HENT: Negative for trouble swallowing.   Eyes: Negative for visual disturbance.  Respiratory: Negative for shortness of breath.   Cardiovascular: Negative for chest pain and palpitations.  Gastrointestinal: Negative for abdominal pain.  Genitourinary: Negative for dysuria.  Musculoskeletal: Positive for back pain.  Neurological: Positive for numbness. Negative for dizziness and headaches.       Objective:   Physical Exam  Constitutional: He is oriented to person, place, and time. He appears well-developed and well-nourished. No distress.  HENT:  Head: Normocephalic and atraumatic.  Right Ear: External ear normal.  Left Ear: External ear normal.  Nose: Nose normal.  Mouth/Throat: Oropharynx is clear and moist.  The pharynx does not appear shallow nor obstructed by the adenoids//nasal passages are fairly clear  Eyes: Conjunctivae and EOM are normal. Pupils are equal, round, and reactive to light.  Neck: Neck supple. No thyromegaly present.  Cardiovascular: Normal rate, regular rhythm and normal heart sounds.   No murmur  heard. Pulmonary/Chest: Effort normal and breath sounds normal. No respiratory distress.  Musculoskeletal: Normal range of motion.  Straight leg raise negative to 90 bilaterally Lumbar palpitation nontender Extremities no sensory or motor losses  DT reflexes symmetrical Gait normal  Lymphadenopathy:    He has no cervical adenopathy.  Neurological: He is alert and oriented to person, place, and time. No cranial nerve deficit.  Skin: Skin is warm and dry. He is not diaphoretic.  Psychiatric: He has a normal mood and affect. His behavior is normal. Thought content normal.  Nursing note and vitals reviewed.  BP 118/76 mmHg  Pulse 82  Temp(Src) 98.6 F (37 C) (Oral)   Resp 16  Ht 6\' 3"  (1.905 m)  Wt 249 lb 3.2 oz (113.036 kg)  BMI 31.15 kg/m2  SpO2 98% Results for orders placed or performed in visit on 12/16/14  POCT CBC  Result Value Ref Range   WBC 3.7 (A) 4.6 - 10.2 K/uL   Lymph, poc 1.4 0.6 - 3.4   POC LYMPH PERCENT 39.0 10 - 50 %L   MID (cbc) 0.4 0 - 0.9   POC MID % 11.3 0 - 12 %M   POC Granulocyte 1.8 (A) 2 - 6.9   Granulocyte percent 49.7 37 - 80 %G   RBC 5.55 4.69 - 6.13 M/uL   Hemoglobin 16.0 14.1 - 18.1 g/dL   HCT, POC 78.4 69.6 - 53.7 %   MCV 85.2 80 - 97 fL   MCH, POC 28.9 27 - 31.2 pg   MCHC 33.9 31.8 - 35.4 g/dL   RDW, POC 29.5 %   Platelet Count, POC 226 142 - 424 K/uL   MPV 8.5 0 - 99.8 fL  POCT glucose (manual entry)  Result Value Ref Range   POC Glucose 51 (A) 70 - 99 mg/dl       Assessment & Plan:  Other fatigue - Plan: Metabolic profile and EB virus serology due to lymphocytosis  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 Meds ordered this encounter  Medications  . ALPRAZolam (XANAX) 0.25 MG tablet    Sig: Take 1 tablet (0.25 mg total) by mouth 3 (three) times daily as needed for anxiety.    Dispense:  90 tablet    Refill:  0     I have completed the patient encounter in its entirety as documented by the scribe, with editing by me where necessary. Basil Blakesley P. Merla Riches, M.D.

## 2014-12-17 ENCOUNTER — Encounter: Payer: Self-pay | Admitting: Internal Medicine

## 2014-12-17 LAB — COMPREHENSIVE METABOLIC PANEL
ALBUMIN: 4.5 g/dL (ref 3.6–5.1)
ALT: 36 U/L (ref 9–46)
AST: 21 U/L (ref 10–40)
Alkaline Phosphatase: 93 U/L (ref 40–115)
BUN: 12 mg/dL (ref 7–25)
CALCIUM: 9.2 mg/dL (ref 8.6–10.3)
CHLORIDE: 104 mmol/L (ref 98–110)
CO2: 25 mmol/L (ref 20–31)
Creat: 0.76 mg/dL (ref 0.60–1.35)
GLUCOSE: 69 mg/dL (ref 65–99)
POTASSIUM: 4.4 mmol/L (ref 3.5–5.3)
Sodium: 140 mmol/L (ref 135–146)
TOTAL PROTEIN: 6.7 g/dL (ref 6.1–8.1)
Total Bilirubin: 0.5 mg/dL (ref 0.2–1.2)

## 2014-12-17 LAB — TSH: TSH: 1.844 u[IU]/mL (ref 0.350–4.500)

## 2014-12-19 LAB — EPSTEIN-BARR VIRUS VCA ANTIBODY PANEL: EBV VCA IgM: <10 U/mL (ref <36.0)

## 2015-02-05 ENCOUNTER — Ambulatory Visit (INDEPENDENT_AMBULATORY_CARE_PROVIDER_SITE_OTHER): Payer: 59 | Admitting: Emergency Medicine

## 2015-02-05 VITALS — BP 126/80 | HR 69 | Temp 98.2°F | Resp 18 | Ht 75.0 in | Wt 247.4 lb

## 2015-02-05 DIAGNOSIS — J0140 Acute pansinusitis, unspecified: Secondary | ICD-10-CM | POA: Diagnosis not present

## 2015-02-05 DIAGNOSIS — J209 Acute bronchitis, unspecified: Secondary | ICD-10-CM | POA: Diagnosis not present

## 2015-02-05 MED ORDER — PSEUDOEPHEDRINE-GUAIFENESIN ER 60-600 MG PO TB12: 1.0000 | ORAL_TABLET | Freq: Two times a day (BID) | ORAL | Status: DC

## 2015-02-05 MED ORDER — AMOXICILLIN-POT CLAVULANATE 875-125 MG PO TABS: 1.0000 | ORAL_TABLET | Freq: Two times a day (BID) | ORAL | Status: DC

## 2015-02-05 MED ORDER — HYDROCOD POLST-CPM POLST ER 10-8 MG/5ML PO SUER: 5.0000 mL | Freq: Two times a day (BID) | ORAL | Status: DC

## 2015-02-05 MED ORDER — ALPRAZOLAM 0.25 MG PO TABS: 0.2500 mg | ORAL_TABLET | Freq: Three times a day (TID) | ORAL | Status: DC | PRN

## 2015-02-05 NOTE — Patient Instructions (Signed)

## 2015-02-05 NOTE — Progress Notes (Signed)
Subjective:  Patient ID: Andrew Castro, male    DOB: 1990-07-29  Age: 24 y.o. MRN: [id number]  CC: Cough; Nasal Congestion; and Medication Refill   HPI Andrew Castro presents  nasal congestion postnasal drainage and nasal discharge all purulent color. He has a cough with no wheezing or shortness of breath or sputum is purulent. He has no fever chills. He has no nausea or vomiting. No stool change. No rash. Said no improvement with over-the-counter medications been ill for a week.  History Orange has a past medical history of Back pain and Spleen injury.   He has no past surgical history on file.   His  family history includes Cancer in his father.  He   reports that he quit smoking about 3 years ago. He has never used smokeless tobacco. He reports that he does not drink alcohol or use illicit drugs.  Outpatient Prescriptions Prior to Visit  Medication Sig Dispense Refill  . ALPRAZolam (XANAX) 0.25 MG tablet Take 1 tablet (0.25 mg total) by mouth 3 (three) times daily as needed for anxiety. 90 tablet 0   No facility-administered medications prior to visit.    Social History   Social History  . Marital Status: Single    Spouse Name: N/A  . Number of Children: N/A  . Years of Education: N/A   Social History Main Topics  . Smoking status: Former Smoker    Quit date: 06/11/2011  . Smokeless tobacco: Never Used  . Alcohol Use: No     Comment: occasionally  . Drug Use: No  . Sexual Activity: Yes   Other Topics Concern  . None   Social History Narrative     Review of Systems  Constitutional: Negative for fever, chills and appetite change.  HENT: Positive for congestion, postnasal drip, rhinorrhea and sinus pressure. Negative for ear pain and sore throat.   Eyes: Negative for pain and redness.  Respiratory: Positive for cough. Negative for shortness of breath and wheezing.   Cardiovascular: Negative for leg swelling.  Gastrointestinal: Negative for nausea,  vomiting, abdominal pain, diarrhea, constipation and blood in stool.  Endocrine: Negative for polyuria.  Genitourinary: Negative for dysuria, urgency, frequency and flank pain.  Musculoskeletal: Negative for gait problem.  Skin: Negative for rash.  Neurological: Negative for weakness and headaches.  Psychiatric/Behavioral: Negative for confusion and decreased concentration. The patient is not nervous/anxious.     Objective:  BP 126/80 mmHg  Pulse 69  Temp(Src) 98.2 F (36.8 C) (Oral)  Resp 18  Ht  (1.905 m)  Wt 247 lb 6.4 oz (112.22 kg)  BMI 30.92 kg/m2  SpO2 98%  Physical Exam  Constitutional: He is oriented to person, place, and time. He appears well-developed and well-nourished. No distress.  HENT:  Head: Normocephalic and atraumatic.  Right Ear: External ear normal.  Left Ear: External ear normal.  Nose: Nose normal.  Eyes: Conjunctivae and EOM are normal. Pupils are equal, round, and reactive to light. No scleral icterus.  Neck: Normal range of motion. Neck supple. No tracheal deviation present.  Cardiovascular: Normal rate, regular rhythm and normal heart sounds.   Pulmonary/Chest: Effort normal. No respiratory distress. He has no wheezes. He has no rales.  Abdominal: He exhibits no mass. There is no tenderness. There is no rebound and no guarding.  Musculoskeletal: He exhibits no edema.  Lymphadenopathy:    He has no cervical adenopathy.  Neurological: He is alert and oriented to person, place, and time. Coordination normal.  Skin: Skin is warm and dry. No rash noted.  Psychiatric: He has a normal mood and affect. His behavior is normal.      Assessment & Plan:   Edge was seen today for cough, nasal congestion and medication refill.  Diagnoses and all orders for this visit:  Acute bronchitis, unspecified organism  Acute pansinusitis, recurrence not specified  Other orders -     amoxicillin-clavulanate (AUGMENTIN) 875-125 MG per tablet; Take 1 tablet  by mouth 2 (two) times daily. -     pseudoephedrine-guaifenesin (MUCINEX D) 60-600 MG per tablet; Take 1 tablet by mouth every 12 (twelve) hours. -     chlorpheniramine-HYDROcodone (TUSSIONEX PENNKINETIC ER) 10-8 MG/5ML SUER; Take 5 mLs by mouth 2 (two) times daily. -     ALPRAZolam (XANAX) 0.25 MG tablet; Take 1 tablet (0.25 mg total) by mouth 3 (three) times daily as needed for anxiety.  I am having Mr. Haile start on amoxicillin-clavulanate, pseudoephedrine-guaifenesin, and chlorpheniramine-HYDROcodone. I am also having him maintain his ALPRAZolam.  Meds ordered this encounter  Medications  . amoxicillin-clavulanate (AUGMENTIN) 875-125 MG per tablet    Sig: Take 1 tablet by mouth 2 (two) times daily.    Dispense:  20 tablet    Refill:  0  . pseudoephedrine-guaifenesin (MUCINEX D) 60-600 MG per tablet    Sig: Take 1 tablet by mouth every 12 (twelve) hours.    Dispense:  18 tablet    Refill:  0  . chlorpheniramine-HYDROcodone (TUSSIONEX PENNKINETIC ER) 10-8 MG/5ML SUER    Sig: Take 5 mLs by mouth 2 (two) times daily.    Dispense:  60 mL    Refill:  0  . ALPRAZolam (XANAX) 0.25 MG tablet    Sig: Take 1 tablet (0.25 mg total) by mouth 3 (three) times daily as needed for anxiety.    Dispense:  90 tablet    Refill:  1    Appropriate red flag conditions were discussed with the patient as well as actions that should be taken.  Patient expressed his understanding.  Follow-up: Return if symptoms worsen or fail to improve.  Carmelina Dane, MD

## 2015-03-06 ENCOUNTER — Ambulatory Visit (INDEPENDENT_AMBULATORY_CARE_PROVIDER_SITE_OTHER): Payer: 59

## 2015-03-06 ENCOUNTER — Ambulatory Visit (INDEPENDENT_AMBULATORY_CARE_PROVIDER_SITE_OTHER): Payer: 59 | Admitting: Family Medicine

## 2015-03-06 VITALS — BP 118/82 | HR 101 | Temp 98.7°F | Resp 18 | Ht 75.0 in | Wt 251.0 lb

## 2015-03-06 DIAGNOSIS — M545 Low back pain, unspecified: Secondary | ICD-10-CM

## 2015-03-06 MED ORDER — NAPROXEN 500 MG PO TABS: 500.0000 mg | ORAL_TABLET | Freq: Two times a day (BID) | ORAL | Status: DC

## 2015-03-06 MED ORDER — CYCLOBENZAPRINE HCL 10 MG PO TABS: 5.0000 mg | ORAL_TABLET | Freq: Three times a day (TID) | ORAL | Status: DC | PRN

## 2015-03-06 MED ORDER — ACETAMINOPHEN 500 MG PO TABS: 1000.0000 mg | ORAL_TABLET | Freq: Three times a day (TID) | ORAL | Status: DC | PRN

## 2015-03-06 NOTE — Progress Notes (Signed)
03/06/2015 at 5:42 PM  Andrew Castro / DOB: Aug 22, 1990 / MRN: [id number]  The patient  does not have a problem list on file.  SUBJECTIVE  Andrew Castro is a 24 y.o. male complaining of stable "achy" midline low back pain that started 4 days ago. Associated symptoms include no other symptoms, and he denies weakness, numbness, tingling, dysuria.Treatments tried thus far include OTC NSAIDS with fair  relief. He denies fever, nausea, dysuria, frequency and urgency. He initially hurt his back 1 year ago lifting a heavy log and has had four flares since.     He  has a past medical history of Back pain and Spleen injury.    Medications reviewed and updated by myself where necessary, and exist elsewhere in the encounter.   Andrew Castro has No Known Allergies. He  reports that he quit smoking about 3 years ago. He has never used smokeless tobacco. He reports that he does not drink alcohol or use illicit drugs. He  reports that he currently engages in sexual activity. The patient  has no past surgical history on file.  His family history includes Cancer in his father.  Review of Systems  Constitutional: Negative for fever.  Eyes: Negative.   Respiratory: Negative.   Cardiovascular: Negative.   Genitourinary: Negative for dysuria, urgency and frequency.  Musculoskeletal: Positive for myalgias and back pain. Negative for falls.  Skin: Negative for itching and rash.  Neurological: Positive for tingling. Negative for dizziness and headaches.    OBJECTIVE  His  height is 6\' 3"  (1.905 m) and weight is 251 lb (113.853 kg). His oral temperature is 98.7 F (37.1 C). His blood pressure is 118/82 and his pulse is 101. His respiration is 18 and oxygen saturation is 98%.  The patient's body mass index is 31.37 kg/(m^2).  Physical Exam  Constitutional: He is oriented to person, place, and time. He appears well-developed.  HENT:  Head: Normocephalic.  Eyes: Pupils are equal, round, and reactive to light.    Neck: Normal range of motion.  Cardiovascular: Normal rate.   Respiratory: Effort normal.  GI: Soft.  Musculoskeletal: Normal range of motion.  Neurological: He is alert and oriented to person, place, and time. He has normal strength. He displays normal reflexes. No cranial nerve deficit or sensory deficit. Gait (antalgic) abnormal. He displays no Babinski's sign on the right side. He displays no Babinski's sign on the left side.  Reflex Scores:      Patellar reflexes are 2+ on the right side and 2+ on the left side.      Achilles reflexes are 2+ on the right side and 2+ on the left side. Skin: Skin is warm and dry.    [facility] reading (PRIMARY) by  Dr. Clelia CroftShaw: Normal.     No results found for this or any previous visit (from the past 24 hour(s)).  ASSESSMENT & PLAN  Andrew Castro was seen today for back pain and depression.  Diagnoses and all orders for this visit:  Bilateral low back pain without sciatica -     DG Lumbar Spine Complete; Future -     naproxen (NAPROSYN) 500 MG tablet; Take 1 tablet (500 mg total) by mouth 2 (two) times daily with a meal. -     acetaminophen (TYLENOL) 500 MG tablet; Take 2 tablets (1,000 mg total) by mouth every 8 (eight) hours as needed for moderate pain. -     cyclobenzaprine (FLEXERIL) 10 MG tablet; Take 0.5-1 tablets (5-10 mg  total) by mouth 3 (three) times daily as needed for muscle spasms. May cause drowsiness.    The patient was advised to call or come back to clinic if he does not see an improvement in symptoms, or worsens with the above plan.   Deliah Boston, MHS, PA-C [facility] 03/06/2015 5:42 PM

## 2015-03-16 NOTE — Progress Notes (Signed)
Patient ID: Andrew Castro, male   DOB: 19-Oct-1990, [age] y.o.   MRN: [id number] Reviewed documentation and xray and agree w/ assessment and plan. Norberto SorensonEva Shaw, MD MPH

## 2015-04-19 ENCOUNTER — Ambulatory Visit (INDEPENDENT_AMBULATORY_CARE_PROVIDER_SITE_OTHER): Payer: 59 | Admitting: Family Medicine

## 2015-04-19 VITALS — BP 132/80 | HR 94 | Temp 98.5°F | Resp 16 | Ht 75.0 in | Wt 244.0 lb

## 2015-04-19 DIAGNOSIS — J069 Acute upper respiratory infection, unspecified: Secondary | ICD-10-CM

## 2015-04-19 MED ORDER — AMOXICILLIN 500 MG PO CAPS: 500.0000 mg | ORAL_CAPSULE | Freq: Three times a day (TID) | ORAL | Status: DC

## 2015-04-19 MED ORDER — FLUTICASONE PROPIONATE 50 MCG/ACT NA SUSP: 2.0000 | Freq: Every day | NASAL | Status: DC

## 2015-04-19 NOTE — Progress Notes (Signed)
Subjective:  This chart was scribed for Elvina SidleKurt Candence Sease MD,  by Veverly FellsHatice Demirci,scribe, at [facility].  This patient was seen in room 13 and the patient's care was started at 9:20 AM.    Patient ID: Andrew Castro, male    DOB: 1990/09/18, 24 y.o.   MRN: [id number] Chief Complaint  Patient presents with  . Nasal Congestion    Onset this AM and drainage down throat  . Discharge from both eyes  . Dizziness    Light headed    HPI  HPI Comments: Andrew Castro is a 24 y.o. male who presents to the [facility] complaining of nasal congestion, eye pain with itching/drainiage/pain, throat pain(with drainage) and head pressure/dizziness onset last night.  He has associated symptoms of sensitive skin and feels like his symptoms are worsening.  He took an amoxilcillin this morning which he had left over from his last Bronchitis infection.  Patient states that he gets colds often this time of year and just got over bronchitis recently.  Patient does not smoke. Patient will [place] leaving [place] the [place] on [place].   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   Patient will be purchasing a home with his house soon and will be changing his job once he does so.   Patient denies a cough, fever/chills.    There are no active problems to display for this patient.  Past Medical History  Diagnosis Date  . Back pain   . Spleen injury    No past surgical history on file. No Known Allergies Prior to Admission medications   Medication Sig Start Date End Date Taking? Authorizing Provider  acetaminophen (TYLENOL) 500 MG tablet Take 2 tablets (1,000 mg total) by mouth every 8 (eight) hours as needed for moderate pain. Patient not taking: Reported on 04/19/2015 03/06/15   Ofilia NeasMichael L Clark, PA-C  ALPRAZolam Prudy Feeler(XANAX) 0.25 MG tablet Take 1 tablet (0.25 mg total) by mouth 3 (three) times daily as needed for anxiety. Patient not taking: Reported on  04/19/2015 02/05/15   Carmelina DaneJeffery S Anderson, MD  cyclobenzaprine (FLEXERIL) 10 MG tablet Take 0.5-1 tablets (5-10 mg total) by mouth 3 (three) times daily as needed for muscle spasms. May cause drowsiness. Patient not taking: Reported on 04/19/2015 03/06/15   Ofilia NeasMichael L Clark, PA-C  ibuprofen (ADVIL,MOTRIN) 200 MG tablet Take 200 mg by mouth every 6 (six) hours as needed.    Historical Provider, MD  naproxen (NAPROSYN) 500 MG tablet Take 1 tablet (500 mg total) by mouth 2 (two) times daily with a meal. Patient not taking: Reported on 04/19/2015 03/06/15   Ofilia NeasMichael L Clark, PA-C   Social History   Social History  . Marital Status: Single    Spouse Name: N/A  . Number of Children: N/A  . Years of Education: N/A   Occupational History  . Not on file.   Social History Main Topics  . Smoking status: Former Smoker    Quit date: 06/11/2011  . Smokeless tobacco: Never Used  . Alcohol Use: No     Comment: occasionally  . Drug Use: No  . Sexual Activity: Yes   Other Topics Concern  . Not on file   Social History Narrative        Review of Systems  Constitutional: Negative for fever and chills.  HENT: Positive for congestion, postnasal drip and sore throat.   Eyes: Positive for pain, redness and itching.  Respiratory: Negative for cough, choking and shortness of breath.   Gastrointestinal: Negative for nausea and vomiting.  Musculoskeletal: Negative for back pain, neck pain and neck stiffness.  Neurological: Positive for dizziness. Negative for syncope and speech difficulty.       Objective:   Physical Exam  Constitutional: He appears well-developed and well-nourished. No distress.  HENT:  Head: Normocephalic and atraumatic.  Eyes: Pupils are equal, round, and reactive to light.    Filed Vitals:   04/19/15 0858  BP: 132/80  Pulse: 94  Temp: 98.5 F (36.9 C)  TempSrc: Oral  Resp: 16  Height:  (1.905 m)  Weight: 244 lb (110.678 kg)         Assessment & Plan:     This chart was scribed in my presence and reviewed by me personally.    ICD-9-CM ICD-10-CM   1. 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 That reason I'm giving him Flonase.  Because he is going on vacation to the [place] in the next couple days, I printed out a prescription for amoxicillin in case she's not getting better. I explained that it's better to not take antibiotic unless he absolutely needs to  Signed, Elvina Sidle, MD

## 2015-04-19 NOTE — Patient Instructions (Signed)
Upper Respiratory Infection, Adult Most upper respiratory infections (URIs) are a viral infection of the air passages leading to the lungs. A URI affects the nose, throat, and upper air passages. The most common type of URI is nasopharyngitis and is typically referred to as "the common cold." URIs run their course and usually go away on their own. Most of the time, a URI does not require medical attention, but sometimes a bacterial infection in the upper airways can follow a viral infection. This is called a secondary infection. Sinus and middle ear infections are common types of secondary upper respiratory infections. Bacterial pneumonia can also complicate a URI. A URI can worsen asthma and chronic obstructive pulmonary disease (COPD). Sometimes, these complications can require emergency medical care and may be life threatening.  CAUSES Almost all URIs are caused by viruses. A virus is a type of germ and can spread from one person to another.  RISKS FACTORS You may be at risk for a URI if:   You smoke.   You have chronic heart or lung disease.  You have a weakened defense (immune) system.   You are very young or very old.   You have nasal allergies or asthma.  You work in crowded or poorly ventilated areas.  You work in health care facilities or schools. SIGNS AND SYMPTOMS  Symptoms typically develop 2-3 days after you come in contact with a cold virus. Most viral URIs last 7-10 days. However, viral URIs from the influenza virus (flu virus) can last 14-18 days and are typically more severe. Symptoms may include:   Runny or stuffy (congested) nose.   Sneezing.   Cough.   Sore throat.   Headache.   Fatigue.   Fever.   Loss of appetite.   Pain in your forehead, behind your eyes, and over your cheekbones (sinus pain).  Muscle aches.  DIAGNOSIS  Your health care provider may diagnose a URI by:  Physical exam.  Tests to check that your symptoms are not due to  another condition such as:  Strep throat.  Sinusitis.  Pneumonia.  Asthma. TREATMENT  A URI goes away on its own with time. It cannot be cured with medicines, but medicines may be prescribed or recommended to relieve symptoms. Medicines may help:  Reduce your fever.  Reduce your cough.  Relieve nasal congestion. HOME CARE INSTRUCTIONS   Take medicines only as directed by your health care provider.   Gargle warm saltwater or take cough drops to comfort your throat as directed by your health care provider.  Use a warm mist humidifier or inhale steam from a shower to increase air moisture. This may make it easier to breathe.  Drink enough fluid to keep your urine clear or pale yellow.   Eat soups and other clear broths and maintain good nutrition.   Rest as needed.   Return to work when your temperature has returned to normal or as your health care provider advises. You may need to stay home longer to avoid infecting others. You can also use a face mask and careful hand washing to prevent spread of the virus.  Increase the usage of your inhaler if you have asthma.   Do not use any tobacco products, including cigarettes, chewing tobacco, or electronic cigarettes. If you need help quitting, ask your health care provider. PREVENTION  The best way to protect yourself from getting a cold is to practice good hygiene.   Avoid oral or hand contact with people with cold   symptoms.   Wash your hands often if contact occurs.  There is no clear evidence that vitamin C, vitamin E, echinacea, or exercise reduces the chance of developing a cold. However, it is always recommended to get plenty of rest, exercise, and practice good nutrition.  SEEK MEDICAL CARE IF:   You are getting worse rather than better.   Your symptoms are not controlled by medicine.   You have chills.  You have worsening shortness of breath.  You have brown or red mucus.  You have yellow or brown nasal  discharge.  You have pain in your face, especially when you bend forward.  You have a fever.  You have swollen neck glands.  You have pain while swallowing.  You have white areas in the back of your throat. SEEK IMMEDIATE MEDICAL CARE IF:   You have severe or persistent:  Headache.  Ear pain.  Sinus pain.  Chest pain.  You have chronic lung disease and any of the following:  Wheezing.  Prolonged cough.  Coughing up blood.  A change in your usual mucus.  You have a stiff neck.  You have changes in your:  Vision.  Hearing.  Thinking.  Mood. MAKE SURE YOU:   Understand these instructions.  Will watch your condition.  Will get help right away if you are not doing well or get worse.   This information is not intended to replace advice given to you by your health care provider. Make sure you discuss any questions you have with your health care provider.   Document Released: 10/30/2000 Document Revised: 09/20/2014 Document Reviewed: 08/11/2013 Elsevier Interactive Patient Education 2016 [employer]  

## 2015-08-05 ENCOUNTER — Ambulatory Visit (INDEPENDENT_AMBULATORY_CARE_PROVIDER_SITE_OTHER): Payer: 59

## 2015-08-05 ENCOUNTER — Ambulatory Visit (INDEPENDENT_AMBULATORY_CARE_PROVIDER_SITE_OTHER): Payer: 59 | Admitting: Family Medicine

## 2015-08-05 VITALS — BP 113/80 | HR 74 | Temp 99.3°F | Resp 16

## 2015-08-05 DIAGNOSIS — R0600 Dyspnea, unspecified: Secondary | ICD-10-CM | POA: Diagnosis not present

## 2015-08-05 DIAGNOSIS — R071 Chest pain on breathing: Secondary | ICD-10-CM | POA: Diagnosis not present

## 2015-08-05 MED ORDER — CYCLOBENZAPRINE HCL 5 MG PO TABS: ORAL_TABLET | ORAL | Status: DC

## 2015-08-05 NOTE — Progress Notes (Addendum)
Subjective:    Patient ID: Andrew Castro, male    DOB: 1991-05-11, 25 y.o.   MRN: [id number] By signing my name below, I, Andrew Castro, attest that this documentation has been prepared under the direction and in the presence of [name], MD. Electronically Signed: Javier Castro, ER Scribe. 08/05/2015. 4:12 PM.  Chief Complaint  Patient presents with  . Shortness of Breath  . Back Pain    behind left lung    HPI HPI Comments: [name] is a 25 y.o. male who presents to [facility] complaining of left-sided mid back pain that started yesterday with associated SOB. He has a past hx of spontaneous splenic laceration in February 2011 with abdominal bleeding. He was hospitalized for a month following that event. His sx today remind him of those sx. He denies nausea or vomiting. Spleen laceration was noted on CT chest, and CT angio on Jul 01, 2009. The handle of a wheelbarrow ran into his abdomen 11 days ago, but no recent abdominal pain. Denies lightheadedness, no dizziness. No recent prolonged travel, no history of DVTs, no calf pain. No prior chest pain or recent illness before this morning, but he does do physical work as a Scientist, water quality.    There are no active problems to display for this patient.  Past Medical History  Diagnosis Date  . Back pain   . Spleen injury    No past surgical history on file. No Known Allergies Prior to Admission medications   Medication Sig Start Date End Date Taking? Authorizing Provider  acetaminophen (TYLENOL) 500 MG tablet Take 2 tablets (1,000 mg total) by mouth every 8 (eight) hours as needed for moderate pain. 03/06/15  Yes Ofilia Neas, PA-C  cyclobenzaprine (FLEXERIL) 10 MG tablet Take 0.5-1 tablets (5-10 mg total) by mouth 3 (three) times daily as needed for muscle spasms. May cause drowsiness. 03/06/15  Yes Ofilia Neas, PA-C  fluticasone (FLONASE) 50 MCG/ACT nasal spray Place 2 sprays into both nostrils daily. 04/19/15  Yes Elvina Sidle, MD  ALPRAZolam Prudy Feeler) 0.25 MG tablet Take 1 tablet (0.25 mg total) by mouth 3 (three) times daily as needed for anxiety. Patient not taking: Reported on 04/19/2015 02/05/15   Carmelina Dane, MD  ibuprofen (ADVIL,MOTRIN) 200 MG tablet Take 200 mg by mouth every 6 (six) hours as needed. Reported on 08/05/2015    Historical Provider, MD  naproxen (NAPROSYN) 500 MG tablet Take 1 tablet (500 mg total) by mouth 2 (two) times daily with a meal. Patient not taking: Reported on 04/19/2015 03/06/15   Ofilia Neas, PA-C   Social History   Social History  . Marital Status: Single    Spouse Name: N/A  . Number of Children: N/A  . Years of Education: N/A   Occupational History  . Not on file.   Social History Main Topics  . Smoking status: Former Smoker    Quit date: 06/11/2011  . Smokeless tobacco: Never Used  . Alcohol Use: No     Comment: occasionally  . Drug Use: No  . Sexual Activity: Yes   Other Topics Concern  . Not on file   Social History Narrative    Review of Systems  Constitutional: Negative for fever and chills.  Respiratory: Positive for shortness of breath. Negative for cough.   Gastrointestinal: Positive for abdominal pain. Negative for nausea and vomiting.      Objective:  BP 113/80 mmHg  Pulse 74  Temp(Src) 99.3  F (37.4 C) (Oral)  Resp 16  SpO2 98%  Physical Exam  Constitutional: He is oriented to person, place, and time. He appears well-developed and well-nourished. No distress.  HENT:  Head: Normocephalic and atraumatic.  Eyes: Pupils are equal, round, and reactive to light.  Neck: Neck supple.  Cardiovascular: Normal rate.   Pulmonary/Chest: Effort normal. No respiratory distress.  Splinted with deep inspiration, but breath sounds clear/equal throughout including apical anterior. Normal effort, no tachypnea. Unable to palpate area of concern on exam.  Some discomfort with trunk rotation, reproduced similar sx's.   Abdominal:  On  abdomen there is a small nearly healed approximately 1.5-2cm across ecchymotic area. Negative collins, no peri umbilical bruising.   Musculoskeletal: Normal range of motion.  Neurological: He is alert and oriented to person, place, and time. Coordination normal.  Skin: Skin is warm and dry. He is not diaphoretic.  Psychiatric: He has a normal mood and affect. His behavior is normal.  Nursing note and vitals reviewed.   Dg Chest 2 View  08/05/2015  CLINICAL DATA:  Dyspnea and chest pain EXAM: CHEST  2 VIEW COMPARISON:  07/07/2009 FINDINGS: The heart size and mediastinal contours are within normal limits. Both lungs are clear. The visualized skeletal structures are unremarkable. IMPRESSION: No active cardiopulmonary disease. Electronically Signed   By: Andrew Palauharles  Castro M.D.   On: 08/05/2015 17:29       Assessment & Plan:   Andrew Castro is a 25 y.o. male Dyspnea - Plan: DG Chest 2 View  Chest pain on breathing - Plan: DG Chest 2 View   History of spontaneous splenic laceration/hematoma 6 years ago. No known injury at that time.  Has been doing well until noted pleuritic type pain this morning. Noted pain in his upper left back area worse with deep breathing. He does work as a Scientist, water qualitybrick mason, but no known injury recently. Does admit to a lower abdominal injury approximately 11 days ago with the handle of a wheelbarrow hit his lower abdomen. However he has not had any recent pain in that area, and faint bruise now healing. Abdomen is nontender on exam, he does not have true radiation of pain to his shoulder like he did last time, appears to be just in his upper left back. No sign of pneumothorax on chest x-ray, and breathing comfortably in the room. Able to reproduce pain with trunk rotation, suspected chest wall pain or upper paraspinal pain/strain.  -Trial of anti-inflammatory, trial of Flexeril 5-10 mg every 8 hours when necessary tonight. (Has had some improvement today with use of these meds). If  his symptoms worsen overnight, or not improving the next day or 2, recommend repeat evaluation here or emergency room for possible CT scanning. Understanding expressed.    Meds ordered this encounter  Medications  . cyclobenzaprine (FLEXERIL) 5 MG tablet    Sig: 1 pill by mouth up to every 8 hours as needed. Start with one pill by mouth each bedtime as needed due to sedation    Dispense:  15 tablet    Refill:  0   Patient Instructions       IF you received an x-ray today, you will receive an invoice from [facility]. Please contact [facility] at [phone] with questions or concerns regarding your invoice.   IF you received labwork today, you will receive an invoice from [facility]. Please contact [facility] at [phone] with questions or concerns regarding your invoice.   Our billing staff  will not be able to assist you with questions regarding bills from these companies.  You will be contacted with the lab results as soon as they are available. The fastest way to get your results is to activate your My Chart account. Instructions are located on the last page of this paperwork. If you have not heard from [place] regarding the results in 2 weeks, please contact this office.     It would be unlikely to have another spleen laceration/bleeding. But as we discussed,  if you are not improving with treatment of muscle spasm/chest wall pain - return here or ER for evaluation to determine if repeat CT scan is needed.   Return to the clinic or go to the nearest emergency room if any of your symptoms worsen or new symptoms occur.    Chest Wall Pain Chest wall pain is pain in or around the bones and muscles of your chest. Sometimes, an injury causes this pain. Sometimes, the cause may not be known. This pain may take several weeks or longer to get better. HOME CARE INSTRUCTIONS  Pay attention to any changes in your symptoms. Take these actions to  help with your pain:   Rest as told by your health care provider.   Avoid activities that cause pain. These include any activities that use your chest muscles or your abdominal and side muscles to lift heavy items.   If directed, apply ice to the painful area:  Put ice in a plastic bag.  Place a towel between your skin and the bag.  Leave the ice on for 20 minutes, 2-3 times per day.  Take over-the-counter and prescription medicines only as told by your health care provider.  Do not use tobacco products, including cigarettes, chewing tobacco, and e-cigarettes. If you need help quitting, ask your health care provider.  Keep all follow-up visits as told by your health care provider. This is important. SEEK MEDICAL CARE IF:  You have a fever.  Your chest pain becomes worse.  You have new symptoms. SEEK IMMEDIATE MEDICAL CARE IF:  You have nausea or vomiting.  You feel sweaty or light-headed.  You have a cough with phlegm (sputum) or you cough up blood.  You develop shortness of breath.   This information is not intended to replace advice given to you by your health care provider. Make sure you discuss any questions you have with your health care provider.   Document Released: 05/06/2005 Document Revised: 01/25/2015 Document Reviewed: 08/01/2014 Elsevier Interactive Patient Education [employer]     I personally performed the services described in this documentation, which was scribed in my presence. The recorded information has been reviewed and considered, and addended by me as needed.

## 2015-08-05 NOTE — Patient Instructions (Addendum)
     IF you received an x-ray today, you will receive an invoice from [facility]. Please contact [facility] at [phone] with questions or concerns regarding your invoice.   IF you received labwork today, you will receive an invoice from [facility]. Please contact [facility] at [phone] with questions or concerns regarding your invoice.   Our billing staff will not be able to assist you with questions regarding bills from these companies.  You will be contacted with the lab results as soon as they are available. The fastest way to get your results is to activate your My Chart account. Instructions are located on the last page of this paperwork. If you have not heard from us regarding the results in 2 weeks, please contact this office.     It would be unlikely to have another spleen laceration/bleeding. But as we discussed,  if you are not improving with treatment of muscle spasm/chest wall pain - return here or ER for evaluation to determine if repeat CT scan is needed.   Return to the clinic or go to the nearest emergency room if any of your symptoms worsen or new symptoms occur.    Chest Wall Pain Chest wall pain is pain in or around the bones and muscles of your chest. Sometimes, an injury causes this pain. Sometimes, the cause may not be known. This pain may take several weeks or longer to get better. HOME CARE INSTRUCTIONS  Pay attention to any changes in your symptoms. Take these actions to help with your pain:   Rest as told by your health care provider.   Avoid activities that cause pain. These include any activities that use your chest muscles or your abdominal and side muscles to lift heavy items.   If directed, apply ice to the painful area:  Put ice in a plastic bag.  Place a towel between your skin and the bag.  Leave the ice on for 20 minutes, 2-3 times per day.  Take over-the-counter and prescription medicines  only as told by your health care provider.  Do not use tobacco products, including cigarettes, chewing tobacco, and e-cigarettes. If you need help quitting, ask your health care provider.  Keep all follow-up visits as told by your health care provider. This is important. SEEK MEDICAL CARE IF:  You have a fever.  Your chest pain becomes worse.  You have new symptoms. SEEK IMMEDIATE MEDICAL CARE IF:  You have nausea or vomiting.  You feel sweaty or light-headed.  You have a cough with phlegm (sputum) or you cough up blood.  You develop shortness of breath.   This information is not intended to replace advice given to you by your health care provider. Make sure you discuss any questions you have with your health care provider.   Document Released: 05/06/2005 Document Revised: 01/25/2015 Document Reviewed: 08/01/2014 Elsevier Interactive Patient Education [employer] Else[employer] Inc[employer]

## 2019-03-02 ENCOUNTER — Other Ambulatory Visit: Payer: Self-pay

## 2019-03-02 ENCOUNTER — Encounter: Payer: Self-pay | Admitting: Emergency Medicine

## 2019-03-02 ENCOUNTER — Ambulatory Visit
Admission: EM | Admit: 2019-03-02 | Discharge: 2019-03-02 | Disposition: A | Discharge status: Home / Self Care | Payer: BC Managed Care – PPO | Attending: Physician Assistant | Admitting: Physician Assistant

## 2019-03-02 DIAGNOSIS — Z20822 Contact with and (suspected) exposure to covid-19: Secondary | ICD-10-CM

## 2019-03-02 DIAGNOSIS — R05 Cough: Secondary | ICD-10-CM | POA: Diagnosis not present

## 2019-03-02 DIAGNOSIS — R059 Cough, unspecified: Secondary | ICD-10-CM

## 2019-03-02 MED ORDER — ALBUTEROL SULFATE HFA 108 (90 BASE) MCG/ACT IN AERS: 1.0000 | INHALATION_SPRAY | Freq: Four times a day (QID) | RESPIRATORY_TRACT | 0 refills | Status: DC | PRN

## 2019-03-02 NOTE — ED Triage Notes (Signed)
Pt presents to [facility] for assessment of 3 days of fatigue, body aches, 99.7 temp, mild cough, shortness of breath with ambulation, loss of taste and smell, upper mid chest pain with cough.

## 2019-03-02 NOTE — Discharge Instructions (Addendum)
As discussed, cannot rule out COVID. Currently, no alarming signs. Testing ordered. I would like you to quarantine until testing results. Albuterol inhaler as needed at this time. If experiencing worsening shortness of breath, trouble breathing, go to the ED for further evaluation needed.

## 2019-03-02 NOTE — ED Notes (Signed)
Patient able to ambulate independently  

## 2019-03-02 NOTE — ED Provider Notes (Signed)
[facility]    CSN: [id number] Arrival date & time: 03/02/19  1843      History   Chief Complaint Chief Complaint  Patient presents with  . COVID-LIke Symptoms    HPI Andrew Castro is a 28 y.o. male.   28 year old male comes in for 3 day history of COVID like symptoms. Has had cough, fatigue, body aches, loss of taste/smell. Has had mild chest pain with cough and dyspnea on exertion. Has had nausea/vomiting/diarrhea that has since resolved. Tmax 99.8 on tylenol. Former smoker. Wife with URI symptoms without COVID testing.      Past Medical History:  Diagnosis Date  . Back pain   . Spleen injury     There are no active problems to display for this patient.   History reviewed. No pertinent surgical history.     Home Medications    Prior to Admission medications   Medication Sig Start Date End Date Taking? Authorizing Provider  acetaminophen (TYLENOL) 500 MG tablet Take 2 tablets (1,000 mg total) by mouth every 8 (eight) hours as needed for moderate pain. 03/06/15   Ofilia Neas, PA-C  albuterol (VENTOLIN HFA) 108 (90 Base) MCG/ACT inhaler Inhale 1-2 puffs into the lungs every 6 (six) hours as needed for wheezing or shortness of breath. 03/02/19   Cathie Hoops, Dreama Kuna V, PA-C  fluticasone (FLONASE) 50 MCG/ACT nasal spray Place 2 sprays into both nostrils daily. 04/19/15   Elvina Sidle, MD  ibuprofen (ADVIL,MOTRIN) 200 MG tablet Take 200 mg by mouth every 6 (six) hours as needed. Reported on 08/05/2015    [provider]    Family History Family History  Problem Relation Age of Onset  . Cancer Father     Social History Social History   Tobacco Use  . Smoking status: Former Smoker    Quit date: 06/11/2011    Years since quitting: 7.7  . Smokeless tobacco: Never Used  Substance Use Topics  . Alcohol use: No    Alcohol/week: 0.0 standard drinks    Comment: occasionally  . Drug use: No     Allergies   Patient has no known allergies.    Review of Systems Review of Systems  Reason unable to perform ROS: See HPI as above.     Physical Exam Triage Vital Signs ED Triage Vitals  Enc Vitals Group     BP 03/02/19 1852 (!) 143/106     Pulse Rate 03/02/19 1852 74     Resp 03/02/19 1852 18     Temp 03/02/19 1852 98 F (36.7 C)     Temp Source 03/02/19 1852 Temporal     SpO2 03/02/19 1852 96 %     Weight --      Height --      Head Circumference --      Peak Flow --      Pain Score 03/02/19 1853 7     Pain Loc --      Pain Edu? --      Excl. in GC? --    No data found.  Updated Vital Signs BP (!) 143/106 (BP Location: Left Arm)   Pulse 74   Temp 98 F (36.7 C) (Temporal)   Resp 18   SpO2 96%   Physical Exam Constitutional:      General: He is not in acute distress.    Appearance: Normal appearance. He is not ill-appearing, toxic-appearing or diaphoretic.  HENT:     Head: Normocephalic and  atraumatic.     Mouth/Throat:     Mouth: Mucous membranes are moist.     Pharynx: Oropharynx is clear. Uvula midline.  Neck:     Musculoskeletal: Normal range of motion and neck supple.  Cardiovascular:     Rate and Rhythm: Normal rate and regular rhythm.     Heart sounds: Normal heart sounds. No murmur. No friction rub. No gallop.   Pulmonary:     Effort: Pulmonary effort is normal. No accessory muscle usage, prolonged expiration, respiratory distress or retractions.     Comments: Able to speak in full sentences without difficulty. Lungs clear to auscultation without adventitious lung sounds. Skin:    General: Skin is warm and dry.  Neurological:     General: No focal deficit present.     Mental Status: He is alert and oriented to person, place, and time.      UC Treatments / Results  Labs (all labs ordered are listed, but only abnormal results are displayed) Labs Reviewed  NOVEL CORONAVIRUS, NAA    EKG   Radiology No results found.  Procedures Procedures (including critical care time)   Medications Ordered in UC Medications - No data to display  Initial Impression / Assessment and Plan / UC Course  I have reviewed the triage vital signs and the nursing notes.  Pertinent labs & imaging results that were available during my care of the patient were reviewed by me and considered in my medical decision making (see chart for details).    No alarming signs on exam.  Patient speaking in full sentences without respiratory distress.  COVID testing ordered.  Patient to quarantine until testing results return.  Symptomatic treatment discussed.  Push fluids.  Return precautions given.  Patient expresses understanding and agrees to plan.  Final Clinical Impressions(s) / UC Diagnoses   Final diagnoses:  Cough  Suspected COVID-19 virus infection   ED Prescriptions    Medication Sig Dispense Auth. Provider   albuterol (VENTOLIN HFA) 108 (90 Base) MCG/ACT inhaler Inhale 1-2 puffs into the lungs every 6 (six) hours as needed for wheezing or shortness of breath. 8 g Ok Edwards, PA-C     PDMP not reviewed this encounter.   Ok Edwards, PA-C 03/02/19 1934

## 2019-03-04 LAB — NOVEL CORONAVIRUS, NAA: SARS-CoV-2, NAA: DETECTED — AB

## 2019-03-05 ENCOUNTER — Telehealth: Payer: Self-pay

## 2019-03-07 ENCOUNTER — Telehealth (HOSPITAL_COMMUNITY): Payer: Self-pay | Admitting: Emergency Medicine

## 2019-03-07 NOTE — Telephone Encounter (Signed)
Patient contacted and made aware of positive covid   results, all questions answered   

## 2020-05-01 ENCOUNTER — Other Ambulatory Visit: Payer: Self-pay

## 2020-05-01 ENCOUNTER — Encounter: Payer: Self-pay | Admitting: Emergency Medicine

## 2020-05-01 ENCOUNTER — Ambulatory Visit
Admission: EM | Admit: 2020-05-01 | Discharge: 2020-05-01 | Disposition: A | Discharge status: Home / Self Care | Payer: 59 | Attending: Emergency Medicine | Admitting: Emergency Medicine

## 2020-05-01 DIAGNOSIS — R42 Dizziness and giddiness: Secondary | ICD-10-CM

## 2020-05-01 MED ORDER — CETIRIZINE HCL 10 MG PO TABS: 10.0000 mg | ORAL_TABLET | Freq: Every day | ORAL | 0 refills | Status: DC

## 2020-05-01 MED ORDER — PREDNISONE 50 MG PO TABS: 50.0000 mg | ORAL_TABLET | Freq: Every day | ORAL | 0 refills | Status: DC

## 2020-05-01 MED ORDER — FLUTICASONE PROPIONATE 50 MCG/ACT NA SUSP
1.0000 | Freq: Every day | NASAL | 0 refills | Status: DC
Start: 2020-05-01 — End: 2021-10-05

## 2020-05-01 MED ORDER — MECLIZINE HCL 25 MG PO TABS: 25.0000 mg | ORAL_TABLET | Freq: Three times a day (TID) | ORAL | 0 refills | Status: DC | PRN

## 2020-05-01 NOTE — ED Triage Notes (Signed)
Patient c/o episodes of dizziness x  4 days.   Patient endorses RT sided ear sensitivity and pressure.   Patient states "it feels like I'm waiting for my brain to catch up with me and I feel off balance.   Patient denies a history of symptoms.

## 2020-05-01 NOTE — ED Provider Notes (Signed)
[facility]    CSN: [id number] Arrival date & time: 05/01/20  1442      History   Chief Complaint Chief Complaint  Patient presents with  . Dizziness    HPI Andrew Castro is a 29 y.o. male  Presenting for 4-day course of intermittent dizziness.  States is worse with certain positions, described as room spinning sensation.  Concern for vertigo as both his father and grandmother have a history thereof.  Also notes right ear pain described as pressure.  No trauma, travel.  Past Medical History:  Diagnosis Date  . Back pain   . Spleen injury     There are no problems to display for this patient.   History reviewed. No pertinent surgical history.     Home Medications    Prior to Admission medications   Medication Sig Start Date End Date Taking? Authorizing Provider  cetirizine (ZYRTEC ALLERGY) 10 MG tablet Take 1 tablet (10 mg total) by mouth daily. 05/01/20   Shippy-Potvin, [place], PA-C  fluticasone (FLONASE) 50 MCG/ACT nasal spray Place 1 spray into both nostrils daily. 05/01/20   Tumlin-Potvin, [place], PA-C  meclizine (ANTIVERT) 25 MG tablet Take 1 tablet (25 mg total) by mouth 3 (three) times daily as needed for dizziness. 05/01/20   Worst-Potvin, [place], PA-C  predniSONE (DELTASONE) 50 MG tablet Take 1 tablet (50 mg total) by mouth daily with breakfast. 05/01/20   Jaime-Potvin, [place], PA-C  albuterol (VENTOLIN HFA) 108 (90 Base) MCG/ACT inhaler Inhale 1-2 puffs into the lungs every 6 (six) hours as needed for wheezing or shortness of breath. 03/02/19 05/01/20  Belinda Fisher, PA-C    Family History Family History  Problem Relation Age of Onset  . Cancer Father     Social History Social History   Tobacco Use  . Smoking status: Former Smoker    Quit date: 06/11/2011    Years since quitting: 8.8  . Smokeless tobacco: Never Used  Substance Use Topics  . Alcohol use: No    Alcohol/week: 0.0 standard drinks    Comment: occasionally  . Drug use: No      Allergies   Patient has no known allergies.   Review of Systems Review of Systems  Constitutional: Negative for fatigue and fever.  HENT: Positive for ear pain. Negative for ear discharge.   Respiratory: Negative for cough and shortness of breath.   Cardiovascular: Negative for chest pain and palpitations.  Gastrointestinal: Negative for abdominal pain, diarrhea and vomiting.  Musculoskeletal: Negative for arthralgias and myalgias.  Skin: Negative for rash and wound.  Neurological: Positive for dizziness. Negative for facial asymmetry, speech difficulty, weakness, numbness and headaches.  All other systems reviewed and are negative.    Physical Exam Triage Vital Signs ED Triage Vitals  Enc Vitals Group     BP 05/01/20 1510 123/86     Pulse Rate 05/01/20 1510 (!) 105     Resp 05/01/20 1510 18     Temp 05/01/20 1510 98.4 F (36.9 C)     Temp Source 05/01/20 1510 Oral     SpO2 05/01/20 1510 95 %     Weight --      Height --      Head Circumference --      Peak Flow --      Pain Score 05/01/20 1511 0     Pain Loc --      Pain Edu? --      Excl. in GC? --  Orthostatic VS for the past 24 hrs:  BP- Lying Pulse- Lying BP- Sitting Pulse- Sitting BP- Standing at 0 minutes Pulse- Standing at 0 minutes  05/01/20 1515 138/86 80 126/84 87 (!) 128/93 103    Updated Vital Signs BP 123/86 (BP Location: Right Arm)   Pulse (!) 105   Temp 98.4 F (36.9 C) (Oral)   Resp 18   SpO2 95%   Visual Acuity Right Eye Distance:   Left Eye Distance:   Bilateral Distance:    Right Eye Near:   Left Eye Near:    Bilateral Near:     Physical Exam Constitutional:      General: He is not in acute distress.    Appearance: He is obese. He is not ill-appearing.  HENT:     Head: Normocephalic and atraumatic.     Right Ear: Ear canal and external ear normal.     Left Ear: Tympanic membrane, ear canal and external ear normal.     Ears:     Comments: TM intact, though slightly  bulging.  Bony landmarks intact.    Mouth/Throat:     Mouth: Mucous membranes are moist.     Pharynx: Oropharynx is clear.  Eyes:     General: No scleral icterus.    Extraocular Movements: Extraocular movements intact.     Conjunctiva/sclera: Conjunctivae normal.     Pupils: Pupils are equal, round, and reactive to light.  Cardiovascular:     Rate and Rhythm: Normal rate and regular rhythm.  Pulmonary:     Effort: Pulmonary effort is normal. No respiratory distress.     Breath sounds: No wheezing.  Musculoskeletal:        General: No deformity. Normal range of motion.     Cervical back: Normal range of motion. No rigidity or tenderness.  Lymphadenopathy:     Cervical: No cervical adenopathy.  Skin:    Capillary Refill: Capillary refill takes less than 2 seconds.     Coloration: Skin is not jaundiced.     Findings: No bruising or rash.  Neurological:     Mental Status: He is alert.     Cranial Nerves: Cranial nerves are intact.     Sensory: Sensation is intact.     Motor: Motor function is intact.     Coordination: Coordination is intact.     Gait: Gait is intact.  Psychiatric:        Mood and Affect: Mood normal.        Behavior: Behavior normal.      UC Treatments / Results  Labs (all labs ordered are listed, but only abnormal results are displayed) Labs Reviewed - No data to display  EKG   Radiology No results found.  Procedures Procedures (including critical care time)  Medications Ordered in UC Medications - No data to display  Initial Impression / Assessment and Plan / UC Course  I have reviewed the triage vital signs and the nursing notes.  Pertinent labs & imaging results that were available during my care of the patient were reviewed by me and considered in my medical decision making (see chart for details).     Appears well in office today and is without neurocognitive deficit.  Differential including vertigo, BPPV, eustachian tube dysfunction.   Will treat supportively as below, follow-up with ENT as needed.  Return precautions discussed, pt verbalized understanding and is agreeable to plan. Final Clinical Impressions(s) / UC Diagnoses   Final diagnoses:  Dizziness   Discharge  Instructions   None    ED Prescriptions    Medication Sig Dispense Auth. Provider   fluticasone (FLONASE) 50 MCG/ACT nasal spray Place 1 spray into both nostrils daily. 16 g Solano-Potvin, [place], PA-C   cetirizine (ZYRTEC ALLERGY) 10 MG tablet Take 1 tablet (10 mg total) by mouth daily. 30 tablet Hinesley-Potvin, [place], PA-C   meclizine (ANTIVERT) 25 MG tablet Take 1 tablet (25 mg total) by mouth 3 (three) times daily as needed for dizziness. 30 tablet Nooney-Potvin, [place], PA-C   predniSONE (DELTASONE) 50 MG tablet Take 1 tablet (50 mg total) by mouth daily with breakfast. 5 tablet Pasch-Potvin, [place], PA-C     PDMP not reviewed this encounter.   Brusca-Potvin, [place], [place] 05/01/20 1646

## 2021-06-19 DIAGNOSIS — J4520 Mild intermittent asthma, uncomplicated: Secondary | ICD-10-CM | POA: Diagnosis not present

## 2021-06-19 DIAGNOSIS — R450 Nervousness: Secondary | ICD-10-CM | POA: Diagnosis not present

## 2021-06-19 DIAGNOSIS — Z79899 Other long term (current) drug therapy: Secondary | ICD-10-CM | POA: Diagnosis not present

## 2021-06-19 DIAGNOSIS — R5383 Other fatigue: Secondary | ICD-10-CM | POA: Diagnosis not present

## 2021-10-05 ENCOUNTER — Ambulatory Visit (INDEPENDENT_AMBULATORY_CARE_PROVIDER_SITE_OTHER): Payer: 59

## 2021-10-05 ENCOUNTER — Encounter: Payer: Self-pay | Admitting: Adult Health

## 2021-10-05 ENCOUNTER — Ambulatory Visit (INDEPENDENT_AMBULATORY_CARE_PROVIDER_SITE_OTHER): Payer: 59 | Admitting: Adult Health

## 2021-10-05 VITALS — BP 138/82 | HR 98 | Temp 98.6°F | Ht 74.25 in | Wt 230.0 lb

## 2021-10-05 DIAGNOSIS — Z0000 Encounter for general adult medical examination without abnormal findings: Secondary | ICD-10-CM | POA: Diagnosis not present

## 2021-10-05 DIAGNOSIS — R002 Palpitations: Secondary | ICD-10-CM

## 2021-10-05 DIAGNOSIS — R079 Chest pain, unspecified: Secondary | ICD-10-CM | POA: Diagnosis not present

## 2021-10-05 DIAGNOSIS — Z23 Encounter for immunization: Secondary | ICD-10-CM | POA: Diagnosis not present

## 2021-10-05 LAB — LDL CHOLESTEROL, DIRECT: Direct LDL: 123 mg/dL

## 2021-10-05 LAB — CBC WITH DIFFERENTIAL/PLATELET
Basophils Absolute: 0 10*3/uL (ref 0.0–0.1)
Basophils Relative: 1 % (ref 0.0–3.0)
Eosinophils Absolute: 0.1 10*3/uL (ref 0.0–0.7)
Eosinophils Relative: 2 % (ref 0.0–5.0)
HCT: 49.5 % (ref 39.0–52.0)
Hemoglobin: 16.3 g/dL (ref 13.0–17.0)
Lymphocytes Relative: 29.6 % (ref 12.0–46.0)
Lymphs Abs: 1.4 10*3/uL (ref 0.7–4.0)
MCHC: 32.9 g/dL (ref 30.0–36.0)
MCV: 88.1 fl (ref 78.0–100.0)
Monocytes Absolute: 0.5 10*3/uL (ref 0.1–1.0)
Monocytes Relative: 11 % (ref 3.0–12.0)
Neutro Abs: 2.7 10*3/uL (ref 1.4–7.7)
Neutrophils Relative %: 56.4 % (ref 43.0–77.0)
Platelets: 305 10*3/uL (ref 150.0–400.0)
RBC: 5.61 Mil/uL (ref 4.22–5.81)
RDW: 13.1 % (ref 11.5–15.5)
WBC: 4.7 10*3/uL (ref 4.0–10.5)

## 2021-10-05 LAB — TSH: TSH: 1.3 u[IU]/mL (ref 0.35–5.50)

## 2021-10-05 LAB — COMPREHENSIVE METABOLIC PANEL
ALT: 23 U/L (ref 0–53)
AST: 17 U/L (ref 0–37)
Albumin: 5.1 g/dL (ref 3.5–5.2)
Alkaline Phosphatase: 63 U/L (ref 39–117)
BUN: 13 mg/dL (ref 6–23)
CO2: 28 mEq/L (ref 19–32)
Calcium: 9.9 mg/dL (ref 8.4–10.5)
Chloride: 104 mEq/L (ref 96–112)
Creatinine, Ser: 0.91 mg/dL (ref 0.40–1.50)
GFR: 110.88 mL/min (ref >60.00)
Glucose, Bld: 86 mg/dL (ref 70–99)
Potassium: 4.6 mEq/L (ref 3.5–5.1)
Sodium: 140 mEq/L (ref 135–145)
Total Bilirubin: 0.8 mg/dL (ref 0.2–1.2)
Total Protein: 7.6 g/dL (ref 6.0–8.3)

## 2021-10-05 LAB — LIPID PANEL
Cholesterol: 216 mg/dL — ABNORMAL HIGH (ref 0–200)
HDL: 41.2 mg/dL (ref >39.00)
NonHDL: 174.83
Total CHOL/HDL Ratio: 5
Triglycerides: 324 mg/dL — ABNORMAL HIGH (ref 0.0–149.0)
VLDL: 64.8 mg/dL — ABNORMAL HIGH (ref 0.0–40.0)

## 2021-10-05 LAB — HEMOGLOBIN A1C: Hgb A1c MFr Bld: 5.3 % (ref 4.6–6.5)

## 2021-10-05 NOTE — Progress Notes (Signed)
Patient presents to clinic today to establish care. He is a pleasant 31 year old male who  has a past medical history of Back pain and Spleen injury.   Acute Concerns: Establish Care  Chronic Issues:  Chest Pain/Palpitations - has been present on and off for the last 8 months. Pain is left sided pain. He will notice palpitations when he is at rest. Seems to happen more in the morning and at night and when he is stressed. He is very active   Health Maintenance: Dental -- Routine Care  Vision -- Routine Care Immunizations -- due for Tdap  Colonoscopy -- never had  Diet: Is eating very healthy  Exercise: Is very active at work. Has a very physical job and enjoys exercising outside of work.   Past Medical History:  Diagnosis Date   Back pain    Spleen injury     History reviewed. No pertinent surgical history.  Current Outpatient Medications on File Prior to Visit  Medication Sig Dispense Refill   Ascorbic Acid (VITAMIN C) 100 MG tablet Take 100 mg by mouth daily.     Garlic 10 MG CAPS Take by mouth.     Omega-3 1000 MG CAPS Take by mouth.     [DISCONTINUED] albuterol (VENTOLIN HFA) 108 (90 Base) MCG/ACT inhaler Inhale 1-2 puffs into the lungs every 6 (six) hours as needed for wheezing or shortness of breath. 8 g 0   No current facility-administered medications on file prior to visit.    No Known Allergies  Family History  Problem Relation Age of Onset   Cancer Father     Social History   Socioeconomic History   Marital status: Single    Spouse name: Not on file   Number of children: Not on file   Years of education: Not on file   Highest education level: Not on file  Occupational History   Not on file  Tobacco Use   Smoking status: Former    Types: Cigarettes    Quit date: 06/11/2011    Years since quitting: 10.3   Smokeless tobacco: Never  Substance and Sexual Activity   Alcohol use: No    Alcohol/week: 0.0 standard drinks    Comment: occasionally    Drug use: No   Sexual activity: Yes  Other Topics Concern   Not on file  Social History Narrative   Not on file   Social Determinants of Health   Financial Resource Strain: Not on file  Food Insecurity: Not on file  Transportation Needs: Not on file  Physical Activity: Not on file  Stress: Not on file  Social Connections: Not on file  Intimate Partner Violence: Not on file    Review of Systems  HENT: Negative.    Eyes: Negative.   Respiratory: Negative.    Cardiovascular:  Positive for chest pain and palpitations. Negative for orthopnea, leg swelling and PND.  Gastrointestinal: Negative.   Genitourinary: Negative.   Musculoskeletal: Negative.   Skin: Negative.   Neurological: Negative.   Endo/Heme/Allergies: Negative.   Psychiatric/Behavioral: Negative.    All other systems reviewed and are negative.  Pulse 98   Temp 98.6 F (37 C) (Oral)   Ht 6' 2.25" (1.886 m)   Wt 230 lb (104.3 kg)   SpO2 98%   BMI 29.33 kg/m   Physical Exam Vitals and nursing note reviewed.  Constitutional:      General: He is not in acute distress.    Appearance: Normal appearance.  He is well-developed and normal weight.  HENT:     Head: Normocephalic and atraumatic.     Right Ear: Tympanic membrane, ear canal and external ear normal. There is no impacted cerumen.     Left Ear: Tympanic membrane, ear canal and external ear normal. There is no impacted cerumen.     Nose: Nose normal. No congestion or rhinorrhea.     Mouth/Throat:     Mouth: Mucous membranes are moist.     Pharynx: Oropharynx is clear. No oropharyngeal exudate or posterior oropharyngeal erythema.  Eyes:     General:        Right eye: No discharge.        Left eye: No discharge.     Extraocular Movements: Extraocular movements intact.     Conjunctiva/sclera: Conjunctivae normal.     Pupils: Pupils are equal, round, and reactive to light.  Neck:     Vascular: No carotid bruit.     Trachea: No tracheal deviation.   Cardiovascular:     Rate and Rhythm: Normal rate and regular rhythm.     Pulses: Normal pulses.     Heart sounds: Normal heart sounds. No murmur heard.   No friction rub. No gallop.  Pulmonary:     Effort: Pulmonary effort is normal. No respiratory distress.     Breath sounds: Normal breath sounds. No stridor. No wheezing, rhonchi or rales.  Chest:     Chest wall: No tenderness.  Abdominal:     General: Bowel sounds are normal. There is no distension.     Palpations: Abdomen is soft. There is no mass.     Tenderness: There is no abdominal tenderness. There is no right CVA tenderness, left CVA tenderness, guarding or rebound.     Hernia: No hernia is present.  Musculoskeletal:        General: No swelling, tenderness, deformity or signs of injury. Normal range of motion.     Right lower leg: No edema.     Left lower leg: No edema.  Lymphadenopathy:     Cervical: No cervical adenopathy.  Skin:    General: Skin is warm and dry.     Capillary Refill: Capillary refill takes less than 2 seconds.     Coloration: Skin is not jaundiced or pale.     Findings: No bruising, erythema, lesion or rash.  Neurological:     General: No focal deficit present.     Mental Status: He is alert and oriented to person, place, and time.     Cranial Nerves: No cranial nerve deficit.     Sensory: No sensory deficit.     Motor: No weakness.     Coordination: Coordination normal.     Gait: Gait normal.     Deep Tendon Reflexes: Reflexes normal.  Psychiatric:        Mood and Affect: Mood normal.        Behavior: Behavior normal.        Thought Content: Thought content normal.        Judgment: Judgment normal.   Assessment/Plan: 1. Routine general medical examination at a health care facility - Healthy 31 year old male  - Follow up in one year or sooner if needed - CBC with Differential/Platelet; Future - Comprehensive metabolic panel; Future - Hemoglobin A1c; Future - Lipid panel; Future - TSH;  Future  2. Need for Tdap vaccination  - Tdap vaccine greater than or equal to 7yo IM  3. Chest pain, unspecified  type  - EKG 12-Lead- Sinus Rhythm - Abnormal precordial QRS contours ( likely artifact) Rate 62 - Will get holter monitor  - CBC with Differential/Platelet; Future - Comprehensive metabolic panel; Future - Hemoglobin A1c; Future - Lipid panel; Future - TSH; Future - Holter monitor - 72 hour; Future  4. Palpitations  - EKG 12-Lead - CBC with Differential/Platelet; Future - Comprehensive metabolic panel; Future - Hemoglobin A1c; Future - Lipid panel; Future - TSH; Future - Holter monitor - 72 hour; Future   Shirline Frees, NP

## 2021-10-05 NOTE — Progress Notes (Unsigned)
Enrolled patient for a 3 day Zio XT monitor to be mailed to patients home  

## 2021-10-05 NOTE — Patient Instructions (Signed)
It was great seeing you today   We will follow up with you regarding your lab work   Please let me know if you need anything   

## 2021-10-08 DIAGNOSIS — R079 Chest pain, unspecified: Secondary | ICD-10-CM

## 2021-10-08 DIAGNOSIS — R002 Palpitations: Secondary | ICD-10-CM | POA: Diagnosis not present

## 2021-10-17 DIAGNOSIS — R079 Chest pain, unspecified: Secondary | ICD-10-CM | POA: Diagnosis not present

## 2021-10-17 DIAGNOSIS — R002 Palpitations: Secondary | ICD-10-CM | POA: Diagnosis not present

## 2022-03-27 ENCOUNTER — Encounter: Payer: Self-pay | Admitting: Family Medicine

## 2022-03-27 ENCOUNTER — Ambulatory Visit (INDEPENDENT_AMBULATORY_CARE_PROVIDER_SITE_OTHER): Payer: BC Managed Care – PPO | Admitting: Family Medicine

## 2022-03-27 VITALS — BP 120/80 | HR 97 | Temp 97.7°F | Ht 74.25 in | Wt 242.3 lb

## 2022-03-27 DIAGNOSIS — H9203 Otalgia, bilateral: Secondary | ICD-10-CM | POA: Diagnosis not present

## 2022-03-27 DIAGNOSIS — J029 Acute pharyngitis, unspecified: Secondary | ICD-10-CM | POA: Diagnosis not present

## 2022-03-27 LAB — POCT RAPID STREP A (OFFICE): Rapid Strep A Screen: NEGATIVE

## 2022-03-27 NOTE — Patient Instructions (Signed)
Strep screen is negative  No evidence for otitis media  Consider trying some over the counter Sudafed to see if this relieves the ear pressure.

## 2022-03-27 NOTE — Progress Notes (Signed)
   Established Patient Office Visit  Subjective   Patient ID: Andrew Castro, male    DOB: 12/28/1990  Age: 31 y.o. MRN: [id number]  Chief Complaint  Patient presents with   Ear Pain    Patient complains of Bi-lateral ear pain, Tried Mucinex with little relief, x2 weeks   Sinusitis    HPI   Bilateral ear pain right greater than left for the past week or so.  He tried over-the-counter medications such as Mucinex without relief.  He did have some preceding nasal congestion.  His children been sick recently.  He had night sweats couple nights but no documented fever.  Initially had some chest congestion but that seems to have cleared some.  Symptoms are worse at night.  Does have some sore throat symptoms and also noticed some adenopathy on the right anterior cervical region  Past Medical History:  Diagnosis Date   Back pain    spleen rupture    History reviewed. No pertinent surgical history.  reports that he quit smoking about 10 years ago. His smoking use included cigarettes. He has never used smokeless tobacco. He reports that he does not drink alcohol and does not use drugs. family history includes Liver cancer in his father; Lung disease in his paternal grandfather; Pulmonary embolism in his sister; Stroke in his maternal grandmother and paternal grandmother. No Known Allergies  Review of Systems  Constitutional:  Negative for chills and fever.  HENT:  Positive for congestion, ear pain and sore throat. Negative for ear discharge.   Respiratory:  Negative for sputum production and wheezing.       Objective:     BP 120/80 (BP Location: Left Arm, Patient Position: Sitting, Cuff Size: Large)   Pulse 97   Temp 97.7 F (36.5 C) (Oral)   Ht 6' 2.25" (1.886 m)   Wt 242 lb 4.8 oz (109.9 kg)   SpO2 98%   BMI 30.90 kg/m    Physical Exam Vitals reviewed.  Constitutional:      General: He is not in acute distress.    Appearance: Normal appearance. He is not toxic-appearing.   HENT:     Right Ear: Tympanic membrane normal.     Left Ear: Tympanic membrane normal.     Mouth/Throat:     Mouth: Mucous membranes are moist.     Pharynx: Posterior oropharyngeal erythema present.     Comments: Does have some posterior pharynx erythema bilaterally but no exudate. Cardiovascular:     Rate and Rhythm: Normal rate and regular rhythm.  Pulmonary:     Effort: Pulmonary effort is normal.     Breath sounds: Normal breath sounds. No wheezing.  Musculoskeletal:     Cervical back: Neck supple. No rigidity.  Neurological:     Mental Status: He is alert.     No results found for any visits on 03/27/22.    The ASCVD Risk score (Arnett DK, et al., 2019) failed to calculate for the following reasons:   The 2019 ASCVD risk score is only valid for ages 21 to 53    Assessment & Plan:   Bilateral ear pain and sore throat.  No evidence on exam for otitis media or any obvious effusion. -Check rapid strep=negative.  -Recommend trial of over-the-counter Sudafed for daytime use.  Caution for possible insomnia for nighttime use  No follow-ups on file.    Evelena Peat, MD

## 2023-02-27 ENCOUNTER — Ambulatory Visit: Payer: BC Managed Care – PPO | Admitting: Adult Health

## 2023-02-27 VITALS — BP 140/100 | HR 81 | Temp 98.4°F | Ht 74.25 in | Wt 251.0 lb

## 2023-02-27 DIAGNOSIS — M545 Low back pain, unspecified: Secondary | ICD-10-CM | POA: Diagnosis not present

## 2023-02-27 MED ORDER — CYCLOBENZAPRINE HCL 10 MG PO TABS
10.0000 mg | ORAL_TABLET | Freq: Three times a day (TID) | ORAL | 0 refills | Status: DC | PRN
Start: 2023-02-27 — End: 2024-03-01

## 2023-02-27 MED ORDER — METHYLPREDNISOLONE 4 MG PO TBPK: ORAL_TABLET | ORAL | 0 refills | Status: DC

## 2023-02-27 NOTE — Progress Notes (Signed)
Subjective:    Patient ID: Andrew Castro, male    DOB: 11-09-1990, 32 y.o.   MRN: [id number]  HPI 32 year old male who  has a past medical history of Back pain and spleen rupture.  He presents to the office today for low back pain. He has had intermittent left sided low back pain for the last 12 years. Initially injured is back 12 years ago when trying to pick up a large log. Since that time he will throw his back out a few times a year. Pain is always on the left side and can radiate down his left leg. Usually rest and warm epsom salt baths help relieve his symptoms. Currently has had back for about a week. He has a physically  demanding job as a Writer and also went hiking over the weekend, he believes he over did it. Has pain in his low back with spasms and radiculopathy. Conservative treatments have not been helping. Pain is worse with walking, twisting, turning, bending and walking.    Review of Systems See HPI   Past Medical History:  Diagnosis Date   Back pain    spleen rupture     Social History   Socioeconomic History   Marital status: Single    Spouse name: Not on file   Number of children: Not on file   Years of education: Not on file   Highest education level: GED or equivalent  Occupational History   Not on file  Tobacco Use   Smoking status: Former    Current packs/day: 0.00    Types: Cigarettes    Quit date: 06/11/2011    Years since quitting: 11.7   Smokeless tobacco: Never  Substance and Sexual Activity   Alcohol use: No    Alcohol/week: 0.0 standard drinks of alcohol    Comment: occasionally   Drug use: No   Sexual activity: Yes  Other Topics Concern   Not on file  Social History Narrative   Andrew Castro    Married   Social Determinants of Health   Financial Resource Strain: Low Risk  (02/27/2023)   Overall Financial Resource Strain (CARDIA)    Difficulty of Paying Living Expenses: Not very hard  Food Insecurity: No Food Insecurity  (02/27/2023)   Hunger Vital Sign    Worried About Running Out of Food in the Last Year: Never true    Ran Out of Food in the Last Year: Never true  Transportation Needs: No Transportation Needs (02/27/2023)   PRAPARE - Administrator, Civil Service (Medical): No    Lack of Transportation (Non-Medical): No  Physical Activity: Sufficiently Active (02/27/2023)   Exercise Vital Sign    Days of Exercise per Week: 5 days    Minutes of Exercise per Session: 150+ min  Stress: Stress Concern Present (02/27/2023)   [employer] of Occupational Health - Occupational Stress Questionnaire    Feeling of Stress : To some extent  Social Connections: Unknown (02/27/2023)   Social Connection and Isolation Panel [NHANES]    Frequency of Communication with Friends and Family: Twice a week    Frequency of Social Gatherings with Friends and Family: Patient declined    Attends Religious Services: Patient declined    Database administrator or Organizations: No    Attends Engineer, structural: Not on file    Marital Status: Married  Catering manager Violence: Not on file    History reviewed. No pertinent surgical history.  Family History  Problem Relation Age of Onset   Liver cancer Father    Pulmonary embolism Sister    Stroke Maternal Grandmother    Stroke Paternal Grandmother    Lung disease Paternal Grandfather     No Known Allergies  Current Outpatient Medications on File Prior to Visit  Medication Sig Dispense Refill   Ascorbic Acid (VITAMIN C) 100 MG tablet Take 100 mg by mouth daily.     Coenzyme Q10 (COQ10 PO) Take by mouth.     Garlic 10 MG CAPS Take by mouth.     Omega-3 1000 MG CAPS Take by mouth.     [DISCONTINUED] albuterol (VENTOLIN HFA) 108 (90 Base) MCG/ACT inhaler Inhale 1-2 puffs into the lungs every 6 (six) hours as needed for wheezing or shortness of breath. 8 g 0   No current facility-administered medications on file prior to visit.    BP (!)  140/100   Pulse 81   Temp 98.4 F (36.9 C) (Oral)   Ht 6' 2.25" (1.886 m)   Wt 251 lb (113.9 kg)   SpO2 97%   BMI 32.01 kg/m       Objective:   Physical Exam Vitals and nursing note reviewed.  Constitutional:      Appearance: Normal appearance.  Cardiovascular:     Rate and Rhythm: Normal rate and regular rhythm.     Pulses: Normal pulses.     Heart sounds: Normal heart sounds.  Musculoskeletal:        General: Tenderness present.       Back:  Skin:    General: Skin is warm and dry.  Neurological:     General: No focal deficit present.     Mental Status: He is alert and oriented to person, place, and time.  Psychiatric:        Mood and Affect: Mood normal.        Behavior: Behavior normal.        Thought Content: Thought content normal.        Judgment: Judgment normal.       Assessment & Plan:  1. Acute left-sided low back pain without sciatica - Appears to be muscular in origin. Will send in medrol dose pack and flexeril. I think he will be a good candidate for PT and dry needling  - methylPREDNISolone (MEDROL DOSEPAK) 4 MG TBPK tablet; Take as directed  Dispense: 21 tablet; Refill: 0 - cyclobenzaprine (FLEXERIL) 10 MG tablet; Take 1 tablet (10 mg total) by mouth 3 (three) times daily as needed for muscle spasms.  Dispense: 60 tablet; Refill: 0 - Ambulatory referral to Physical Therapy - Continue with home therapy  - Follow up if not improving in the next 2-3 days   Shirline Frees, NP  Time spent with patient today was 30 minutes which consisted of chart review, discussing muscle strain, spasms and low back pain work up, treatment answering questions and documentation.

## 2023-03-06 ENCOUNTER — Ambulatory Visit: Payer: BC Managed Care – PPO | Admitting: Physical Therapy

## 2024-03-01 ENCOUNTER — Other Ambulatory Visit: Payer: Self-pay

## 2024-03-01 ENCOUNTER — Emergency Department (HOSPITAL_COMMUNITY)

## 2024-03-01 ENCOUNTER — Emergency Department (HOSPITAL_COMMUNITY): Admission: EM | Admit: 2024-03-01 | Discharge: 2024-03-01 | Disposition: A | Discharge status: Home / Self Care

## 2024-03-01 ENCOUNTER — Encounter (HOSPITAL_COMMUNITY): Payer: Self-pay

## 2024-03-01 DIAGNOSIS — Y92410 Unspecified street and highway as the place of occurrence of the external cause: Secondary | ICD-10-CM | POA: Insufficient documentation

## 2024-03-01 DIAGNOSIS — S199XXA Unspecified injury of neck, initial encounter: Secondary | ICD-10-CM | POA: Diagnosis not present

## 2024-03-01 DIAGNOSIS — R0781 Pleurodynia: Secondary | ICD-10-CM | POA: Diagnosis not present

## 2024-03-01 DIAGNOSIS — R0789 Other chest pain: Secondary | ICD-10-CM | POA: Insufficient documentation

## 2024-03-01 DIAGNOSIS — M542 Cervicalgia: Secondary | ICD-10-CM | POA: Diagnosis not present

## 2024-03-01 DIAGNOSIS — S299XXA Unspecified injury of thorax, initial encounter: Secondary | ICD-10-CM | POA: Diagnosis not present

## 2024-03-01 DIAGNOSIS — R079 Chest pain, unspecified: Secondary | ICD-10-CM | POA: Diagnosis not present

## 2024-03-01 DIAGNOSIS — S0990XA Unspecified injury of head, initial encounter: Secondary | ICD-10-CM | POA: Diagnosis not present

## 2024-03-01 MED ORDER — KETOROLAC TROMETHAMINE 15 MG/ML IJ SOLN
15.0000 mg | Freq: Once | INTRAMUSCULAR | Status: AC
  Administered 2024-03-01: 15 mg via INTRAMUSCULAR
  Filled 2024-03-01: qty 1

## 2024-03-01 NOTE — Discharge Instructions (Signed)
 For pain, you can take 1000 mg of Tylenol  or 1 g of Tylenol  every 6-8 hours.  Do not exceed more than 4000 mg or 4 g in a 24-hour period.  You can also take ibuprofen  600 to 800 mg every 6-8 hours as well.  Do not take this high-dose ibuprofen  for greater than a week.   If you have any kind of chest pain or shortness of breath then please come back to the ED for further evaluation.
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
